# Patient Record
Sex: Male | Born: 1950
Health system: Southern US, Community
[De-identification: ages and names within clinical notes are randomized; demographics above are authoritative.]

## PROBLEM LIST (undated history)

## (undated) DIAGNOSIS — F17200 Nicotine dependence, unspecified, uncomplicated: Secondary | ICD-10-CM

## (undated) DIAGNOSIS — J449 Chronic obstructive pulmonary disease, unspecified: Secondary | ICD-10-CM

## (undated) DIAGNOSIS — E785 Hyperlipidemia, unspecified: Secondary | ICD-10-CM

## (undated) DIAGNOSIS — A150 Tuberculosis of lung: Secondary | ICD-10-CM

## (undated) DIAGNOSIS — R059 Cough, unspecified: Secondary | ICD-10-CM

## (undated) DIAGNOSIS — R9389 Abnormal findings on diagnostic imaging of other specified body structures: Secondary | ICD-10-CM

## (undated) DIAGNOSIS — I10 Essential (primary) hypertension: Secondary | ICD-10-CM

## (undated) DIAGNOSIS — M1990 Unspecified osteoarthritis, unspecified site: Secondary | ICD-10-CM

## (undated) DIAGNOSIS — Z8611 Personal history of tuberculosis: Secondary | ICD-10-CM

## (undated) DIAGNOSIS — R05 Cough: Secondary | ICD-10-CM

## (undated) HISTORY — DX: Chronic obstructive pulmonary disease, unspecified: J44.9

## (undated) HISTORY — DX: Hyperlipidemia, unspecified: E78.5

## (undated) HISTORY — DX: Essential (primary) hypertension: I10

## (undated) HISTORY — DX: Tuberculosis of lung: A15.0

## (undated) HISTORY — DX: Abnormal findings on diagnostic imaging of other specified body structures: R93.89

## (undated) HISTORY — DX: Nicotine dependence, unspecified, uncomplicated: F17.200

## (undated) HISTORY — PX: SECONDARY CLOSURE ARM: SHX5151

## (undated) HISTORY — DX: Cough, unspecified: R05.9

## (undated) HISTORY — DX: Personal history of tuberculosis: Z86.11

## (undated) HISTORY — PX: KNEE SURGERY: SHX244

---
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
## 2012-07-10 ENCOUNTER — Emergency Department (HOSPITAL_COMMUNITY)
Admission: EM | Admit: 2012-07-10 | Discharge: 2012-07-11 | Disposition: A | Discharge status: Home / Self Care | Payer: Self-pay | Attending: Emergency Medicine | Admitting: Emergency Medicine

## 2012-07-10 DIAGNOSIS — Z9889 Other specified postprocedural states: Secondary | ICD-10-CM | POA: Insufficient documentation

## 2012-07-10 DIAGNOSIS — Z791 Long term (current) use of non-steroidal anti-inflammatories (NSAID): Secondary | ICD-10-CM | POA: Insufficient documentation

## 2012-07-10 DIAGNOSIS — H5789 Other specified disorders of eye and adnexa: Secondary | ICD-10-CM | POA: Insufficient documentation

## 2012-07-10 DIAGNOSIS — S0181XA Laceration without foreign body of other part of head, initial encounter: Secondary | ICD-10-CM

## 2012-07-10 DIAGNOSIS — S0010XA Contusion of unspecified eyelid and periocular area, initial encounter: Secondary | ICD-10-CM | POA: Insufficient documentation

## 2012-07-10 DIAGNOSIS — F17200 Nicotine dependence, unspecified, uncomplicated: Secondary | ICD-10-CM | POA: Insufficient documentation

## 2012-07-10 DIAGNOSIS — H05239 Hemorrhage of unspecified orbit: Secondary | ICD-10-CM

## 2012-07-10 DIAGNOSIS — Z79899 Other long term (current) drug therapy: Secondary | ICD-10-CM | POA: Insufficient documentation

## 2012-07-10 DIAGNOSIS — S022XXA Fracture of nasal bones, initial encounter for closed fracture: Secondary | ICD-10-CM | POA: Insufficient documentation

## 2012-07-10 DIAGNOSIS — H40059 Ocular hypertension, unspecified eye: Secondary | ICD-10-CM | POA: Insufficient documentation

## 2012-07-10 DIAGNOSIS — Z23 Encounter for immunization: Secondary | ICD-10-CM | POA: Insufficient documentation

## 2012-07-10 MED ORDER — TETRACAINE HCL 0.5 % OP SOLN
OPHTHALMIC | Status: AC
  Administered 2012-07-11: 02:00:00
  Filled 2012-07-10: qty 2

## 2012-07-10 MED ORDER — FLUORESCEIN SODIUM 1 MG OP STRP
ORAL_STRIP | OPHTHALMIC | Status: AC
  Filled 2012-07-10: qty 1

## 2012-07-10 NOTE — ED Notes (Addendum)
MD at bedside. Dr Hyacinth Meeker suturing pt before triage was done
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
## 2012-07-11 ENCOUNTER — Emergency Department (HOSPITAL_COMMUNITY): Payer: Self-pay

## 2012-07-11 ENCOUNTER — Encounter (HOSPITAL_COMMUNITY): Payer: Self-pay | Admitting: *Deleted

## 2012-07-11 MED ORDER — TIMOLOL MALEATE 0.5 % OP SOLN
1.0000 [drp] | Freq: Once | OPHTHALMIC | Status: AC
  Administered 2012-07-11: 1 [drp] via OPHTHALMIC
  Filled 2012-07-11: qty 5

## 2012-07-11 MED ORDER — BACITRACIN ZINC 500 UNIT/GM EX OINT
TOPICAL_OINTMENT | Freq: Two times a day (BID) | CUTANEOUS | Status: DC
Start: 2012-07-11 — End: 2013-02-05

## 2012-07-11 MED ORDER — BACITRACIN ZINC 500 UNIT/GM EX OINT
TOPICAL_OINTMENT | CUTANEOUS | Status: AC
  Filled 2012-07-11: qty 0.9

## 2012-07-11 MED ORDER — TETANUS-DIPHTH-ACELL PERTUSSIS 5-2.5-18.5 LF-MCG/0.5 IM SUSP
0.5000 mL | Freq: Once | INTRAMUSCULAR | Status: AC
  Administered 2012-07-11: 0.5 mL via INTRAMUSCULAR
  Filled 2012-07-11: qty 0.5

## 2012-07-11 MED ORDER — OXYMETAZOLINE HCL 0.05 % NA SOLN: 2.0000 | Freq: Two times a day (BID) | NASAL | Status: DC

## 2012-07-11 MED ORDER — LIDOCAINE-EPINEPHRINE (PF) 1 %-1:200000 IJ SOLN
INTRAMUSCULAR | Status: AC
  Administered 2012-07-11: 10 mL
  Filled 2012-07-11: qty 10

## 2012-07-11 MED ORDER — OXYCODONE-ACETAMINOPHEN 5-325 MG PO TABS
2.0000 | ORAL_TABLET | Freq: Once | ORAL | Status: AC
  Administered 2012-07-11: 2 via ORAL
  Filled 2012-07-11: qty 2

## 2012-07-11 MED ORDER — LATANOPROST 0.005 % OP SOLN
1.0000 [drp] | Freq: Once | OPHTHALMIC | Status: AC
  Administered 2012-07-11: 1 [drp] via OPHTHALMIC
  Filled 2012-07-11: qty 2.5

## 2012-07-11 MED ORDER — NAPROXEN 500 MG PO TABS: 500.0000 mg | ORAL_TABLET | Freq: Two times a day (BID) | ORAL | Status: DC

## 2012-07-11 MED ORDER — LATANOPROST 0.005 % OP SOLN
OPHTHALMIC | Status: AC
  Filled 2012-07-11: qty 2.5

## 2012-07-11 MED ORDER — OXYCODONE-ACETAMINOPHEN 5-325 MG PO TABS: 1.0000 | ORAL_TABLET | ORAL | Status: DC | PRN
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
## 2012-07-11 NOTE — ED Notes (Signed)
Pt alert & oriented x4, stable gait. Patient given discharge instructions, paperwork & prescription(s). Patient  instructed to stop at the registration desk to finish any additional paperwork. Patient verbalized understanding. Pt left department w/ no further questions. 

## 2013-02-05 ENCOUNTER — Emergency Department (HOSPITAL_COMMUNITY)
Admission: EM | Admit: 2013-02-05 | Discharge: 2013-02-05 | Disposition: A | Discharge status: Home / Self Care | Payer: Self-pay | Attending: Emergency Medicine | Admitting: Emergency Medicine

## 2013-02-05 ENCOUNTER — Encounter (HOSPITAL_COMMUNITY): Payer: Self-pay | Admitting: *Deleted

## 2013-02-05 DIAGNOSIS — S0180XA Unspecified open wound of other part of head, initial encounter: Secondary | ICD-10-CM | POA: Insufficient documentation

## 2013-02-05 DIAGNOSIS — Z880 Allergy status to penicillin: Secondary | ICD-10-CM | POA: Insufficient documentation

## 2013-02-05 DIAGNOSIS — S0990XA Unspecified injury of head, initial encounter: Secondary | ICD-10-CM | POA: Insufficient documentation

## 2013-02-05 DIAGNOSIS — S0181XA Laceration without foreign body of other part of head, initial encounter: Secondary | ICD-10-CM

## 2013-02-05 DIAGNOSIS — F17200 Nicotine dependence, unspecified, uncomplicated: Secondary | ICD-10-CM | POA: Insufficient documentation

## 2013-02-05 MED ORDER — BACITRACIN ZINC 500 UNIT/GM EX OINT: TOPICAL_OINTMENT | Freq: Two times a day (BID) | CUTANEOUS | Status: DC

## 2013-02-05 MED ORDER — NAPROXEN 500 MG PO TABS: 500.0000 mg | ORAL_TABLET | Freq: Two times a day (BID) | ORAL | Status: DC

## 2013-02-05 MED ORDER — BACITRACIN ZINC 500 UNIT/GM EX OINT
TOPICAL_OINTMENT | CUTANEOUS | Status: AC
  Administered 2013-02-05: 21:00:00
  Filled 2013-02-05: qty 0.9

## 2013-02-05 MED ORDER — LIDOCAINE-EPINEPHRINE (PF) 1 %-1:200000 IJ SOLN
INTRAMUSCULAR | Status: AC
  Administered 2013-02-05: 21:00:00
  Filled 2013-02-05: qty 10
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
## 2013-02-05 NOTE — ED Notes (Signed)
EDP at bedside placed 10 sutures in 2 lacerations to left forehead. Pt tolerated well.
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
## 2013-02-05 NOTE — ED Notes (Signed)
Dressing applied per EDP. Bacitracin applied. Bleeding controlled at this time.

## 2016-01-08 ENCOUNTER — Encounter (HOSPITAL_COMMUNITY): Payer: Self-pay | Admitting: Emergency Medicine

## 2016-01-08 ENCOUNTER — Emergency Department (HOSPITAL_COMMUNITY): Payer: PPO

## 2016-01-08 DIAGNOSIS — S72111A Displaced fracture of greater trochanter of right femur, initial encounter for closed fracture: Secondary | ICD-10-CM | POA: Insufficient documentation

## 2016-01-08 DIAGNOSIS — W010XXA Fall on same level from slipping, tripping and stumbling without subsequent striking against object, initial encounter: Secondary | ICD-10-CM | POA: Insufficient documentation

## 2016-01-08 DIAGNOSIS — S199XXA Unspecified injury of neck, initial encounter: Secondary | ICD-10-CM | POA: Diagnosis not present

## 2016-01-08 DIAGNOSIS — S0990XA Unspecified injury of head, initial encounter: Secondary | ICD-10-CM | POA: Diagnosis not present

## 2016-01-08 DIAGNOSIS — S8991XA Unspecified injury of right lower leg, initial encounter: Secondary | ICD-10-CM | POA: Diagnosis not present

## 2016-01-08 DIAGNOSIS — F17210 Nicotine dependence, cigarettes, uncomplicated: Secondary | ICD-10-CM | POA: Insufficient documentation

## 2016-01-08 DIAGNOSIS — Y9389 Activity, other specified: Secondary | ICD-10-CM | POA: Diagnosis not present

## 2016-01-08 DIAGNOSIS — Y929 Unspecified place or not applicable: Secondary | ICD-10-CM | POA: Diagnosis not present

## 2016-01-08 DIAGNOSIS — Y999 Unspecified external cause status: Secondary | ICD-10-CM | POA: Insufficient documentation

## 2016-01-08 DIAGNOSIS — S79921A Unspecified injury of right thigh, initial encounter: Secondary | ICD-10-CM | POA: Diagnosis not present

## 2016-01-08 DIAGNOSIS — M79651 Pain in right thigh: Secondary | ICD-10-CM | POA: Diagnosis not present

## 2016-01-08 NOTE — ED Notes (Addendum)
Patient states he fell approximately 1 hour ago and is complaining of upper right leg pain since fall. Patient admits to drinking 2 beers prior to arrival to ER.

## 2016-01-09 ENCOUNTER — Emergency Department (HOSPITAL_COMMUNITY)
Admission: EM | Admit: 2016-01-09 | Discharge: 2016-01-09 | Disposition: A | Discharge status: Home / Self Care | Payer: PPO | Attending: Emergency Medicine | Admitting: Emergency Medicine

## 2016-01-09 ENCOUNTER — Ambulatory Visit (INDEPENDENT_AMBULATORY_CARE_PROVIDER_SITE_OTHER): Payer: PPO | Admitting: Orthopedic Surgery

## 2016-01-09 ENCOUNTER — Emergency Department (HOSPITAL_COMMUNITY): Payer: PPO

## 2016-01-09 VITALS — BP 121/78 | HR 82 | Ht 66.5 in | Wt 116.0 lb

## 2016-01-09 DIAGNOSIS — S0990XA Unspecified injury of head, initial encounter: Secondary | ICD-10-CM | POA: Diagnosis not present

## 2016-01-09 DIAGNOSIS — S72111A Displaced fracture of greater trochanter of right femur, initial encounter for closed fracture: Secondary | ICD-10-CM

## 2016-01-09 DIAGNOSIS — R52 Pain, unspecified: Secondary | ICD-10-CM

## 2016-01-09 DIAGNOSIS — S199XXA Unspecified injury of neck, initial encounter: Secondary | ICD-10-CM | POA: Diagnosis not present

## 2016-01-09 MED ORDER — OXYCODONE-ACETAMINOPHEN 5-325 MG PO TABS
1.0000 | ORAL_TABLET | Freq: Once | ORAL | Status: AC
  Administered 2016-01-09: 1 via ORAL
  Filled 2016-01-09: qty 1

## 2016-01-09 MED ORDER — HYDROCODONE-ACETAMINOPHEN 10-325 MG PO TABS: 1.0000 | ORAL_TABLET | ORAL | Status: DC | PRN

## 2016-01-09 MED ORDER — OXYCODONE-ACETAMINOPHEN 5-325 MG PO TABS: 1.0000 | ORAL_TABLET | ORAL | Status: DC | PRN

## 2016-01-09 NOTE — ED Notes (Signed)
Pt gone over to CT 

## 2016-01-09 NOTE — Patient Instructions (Signed)
WALKER X 4 WEEKS

## 2016-01-09 NOTE — ED Notes (Signed)
Patient states that he does not want ice pak at this time. States that he is not having any pain, just not weight bearing on right leg.

## 2016-01-09 NOTE — ED Notes (Signed)
Pt requesting something for pain; EDP made aware and new orders given

## 2016-01-09 NOTE — ED Provider Notes (Signed)
CSN: [id number]     Arrival date & time 01/08/16  2239 History   First MD Initiated Contact with Patient 01/09/16 0006     Chief Complaint  Patient presents with  . Leg Pain   HPI  Scott Ferguson is a 65 y.o. male presenting with right leg pain s/p mechanical fall 1 hour PTA. He states he was carrying wood, dropped a piece avoid and subsequently tripped over it, landing onto his left knee. He describes his pain as mid femur in location, radiating up to right hip as well as down to right knee, constant, 10/10 pain scale, achy. He states he is mostly concerned because he has had surgery on his right knee and he wants to ensure that the hardware is in place. No fevers, chills, head injury, loss of consciousness, nausea, vomiting, loss of bowel or bladder control, numbness/tingling.  History reviewed. No pertinent past medical history. Past Surgical History  Procedure Laterality Date  . Knee surgery    . Secondary closure arm     History reviewed. No pertinent family history. Social History  Substance Use Topics  . Smoking status: Current Every Day Smoker -- 0.50 packs/day    Types: Cigarettes  . Smokeless tobacco: None  . Alcohol Use: Yes     Comment: admits to drinking 2 beers tonight    Review of Systems  Ten systems are reviewed and are negative for acute change except as noted in the HPI   Allergies  Penicillins  Home Medications   Prior to Admission medications   Medication Sig Start Date End Date Taking? Authorizing Provider  bacitracin ointment Apply topically 2 (two) times daily. 02/05/13   Eber Hong, MD  naproxen (NAPROSYN) 500 MG tablet Take 1 tablet (500 mg total) by mouth 2 (two) times daily with a meal. 02/05/13   Eber Hong, MD   BP 157/98 mmHg  Pulse 81  Temp(Src) 98 F (36.7 C) (Oral)  Resp 16  Ht 5' 6.5" (1.689 m)  Wt 63.504 kg  BMI 22.26 kg/m2  SpO2 96% Physical Exam  Constitutional: He appears well-developed and well-nourished. No distress.   HENT:  Head: Normocephalic and atraumatic.  Mouth/Throat: Oropharynx is clear and moist. No oropharyngeal exudate.  Eyes: Conjunctivae are normal. Pupils are equal, round, and reactive to light. Right eye exhibits no discharge. Left eye exhibits no discharge. No scleral icterus.  Neck: No tracheal deviation present.  Cardiovascular: Normal rate, regular rhythm, normal heart sounds and intact distal pulses.  Exam reveals no gallop and no friction rub.   No murmur heard. Pulmonary/Chest: Effort normal and breath sounds normal. No respiratory distress. He has no wheezes. He has no rales. He exhibits no tenderness.  Abdominal: Soft. Bowel sounds are normal. He exhibits no distension and no mass. There is no tenderness. There is no rebound and no guarding.  Musculoskeletal: Normal range of motion. He exhibits tenderness. He exhibits no edema.  Mid right femur and right hip tenderness. No discolorations, erythema. NVI BL.   Lymphadenopathy:    He has no cervical adenopathy.  Neurological: He is alert. Coordination normal.  Skin: Skin is warm and dry. No rash noted. He is not diaphoretic. No erythema.  Psychiatric: He has a normal mood and affect. His behavior is normal.  Nursing note and vitals reviewed.   ED Course  Procedures  Imaging Review Dg Femur, Min 2 Views Right  01/08/2016  CLINICAL DATA:  Fall this afternoon while cleaning up a tree that fell in his  yard. Pain in the mid femur. EXAM: RIGHT FEMUR 2 VIEWS COMPARISON:  None. FINDINGS: Questionable cortical irregularity about the greater trochanter versus positioning. The right femur is otherwise intact. Particularly no fracture of the femoral shaft. Postsurgical change noted in the patella. Femoral head is well seated. No focal soft tissue abnormality. IMPRESSION: Questionable cortical irregularity about the greater trochanter of the right hip. This may be artifact if there is no focal pain in this region. No additional fracture of the  right femur. Electronically Signed   By: Rubye OaksMelanie  Ehinger M.D.   On: 01/08/2016 23:18   I have personally reviewed and evaluated these images and lab results as part of my medical decision-making.  MDM   Final diagnoses:  Pain   Patient's right femur XR demonstrates questionable cortical irregularity about the greater trochanter of the right hip. This may be artifact if there is no focal pain in this region. No additional fracture of the right femur. Will CT right hip for further evaluation. No evidence of trauma elsewhere.  Patient states his pain is 10/10 pain scale, but he has had at least 2 beers PTA and is sleeping/resting comfortably upon examination. Doubt DVT and septic joint.  Discussed case with Rancour and Harrison- weight bearing as tolerated and pain meds.  Dr. Manus Gunningancour evaluated patient as well and advises head CT and c-spine.  Care handoff to Dr. Manus Gunningancour at shift change.   Melton KrebsSamantha Nicole Jovonna Nickell, PA-C 01/09/16 [id number]  Glynn OctaveStephen Rancour, MD 01/09/16 [phone]

## 2016-01-09 NOTE — Discharge Instructions (Signed)
Do not put weight on your Right leg. Follow up with Dr. Romeo AppleHarrison. Return to the ED if you develop new or worsening symptoms.

## 2016-01-11 NOTE — Progress Notes (Signed)
Chief Complaint  Patient presents with  . Follow-up    Fractred right hip DOI 01/08/16   HPI 65 year old male fell on the date listed he complains of pain over his right hip dull sometimes sharp with motion fairly severe of one days duration being injured last night pain is constant worse with walking  Review of Systems  Constitutional: Negative for fever and chills.  Musculoskeletal: Negative for back pain and joint pain.  Neurological: Negative for dizziness.    No past medical history on file.  Past Surgical History  Procedure Laterality Date  . Knee surgery    . Secondary closure arm     No family history on file. Social History  Substance Use Topics  . Smoking status: Current Every Day Smoker -- 0.50 packs/day    Types: Cigarettes  . Smokeless tobacco: Not on file  . Alcohol Use: Yes     Comment: admits to drinking 2 beers tonight    Current outpatient prescriptions:  .  oxyCODONE-acetaminophen (PERCOCET/ROXICET) 5-325 MG tablet, Take 1 tablet by mouth every 4 (four) hours as needed for severe pain., Disp: 15 tablet, Rfl: 0 .  bacitracin ointment, Apply topically 2 (two) times daily. (Patient not taking: Reported on 01/09/2016), Disp: 120 g, Rfl: 0 .  HYDROcodone-acetaminophen (NORCO) 10-325 MG tablet, Take 1 tablet by mouth every 4 (four) hours as needed., Disp: 56 tablet, Rfl: 0 .  naproxen (NAPROSYN) 500 MG tablet, Take 1 tablet (500 mg total) by mouth 2 (two) times daily with a meal. (Patient not taking: Reported on 01/09/2016), Disp: 30 tablet, Rfl: 0  BP 121/78 mmHg  Pulse 82  Ht 5' 6.5" (1.689 m)  Wt 116 lb (52.617 kg)  BMI 18.44 kg/m2  Physical Exam  Constitutional: He is oriented to person, place, and time. He appears well-developed and well-nourished. No distress.  Cardiovascular: Normal rate and intact distal pulses.   Neurological: He is alert and oriented to person, place, and time.  Skin: Skin is warm and dry. No rash noted. He is not diaphoretic. No  erythema. No pallor.  Psychiatric: He has a normal mood and affect. His behavior is normal. Judgment and thought content normal.  Ambulation is with a walker with a limp  Right Hip Exam   Tenderness  The patient is experiencing tenderness in the greater trochanter.  Range of Motion  Extension: abnormal  Flexion: abnormal  Internal Rotation: abnormal  External Rotation: abnormal  Abduction: abnormal  Adduction: abnormal   Muscle Strength  Abduction: 3/5  Adduction: 5/5  Flexion: 5/5   Other  Erythema: absent Scars: absent Sensation: normal Pulse: present   Left Hip Exam  Left hip exam is normal.  Tenderness  The patient is experiencing no tenderness.     Range of Motion  The patient has normal left hip ROM.  Muscle Strength  The patient has normal left hip strength.   Tests  FABER: negative  Other  Erythema: absent Scars: absent Sensation: normal Pulse: present       ASSESSMENT: My personal interpretation of the images:  Greater trochanteric fracture right hip nondisplaced    PLAN Protected weightbearing with walker  Prescription opiate analgesia  X-ray on return

## 2016-01-21 ENCOUNTER — Other Ambulatory Visit: Payer: Self-pay | Admitting: Orthopedic Surgery

## 2016-01-21 ENCOUNTER — Telehealth: Payer: Self-pay | Admitting: Orthopedic Surgery

## 2016-01-21 DIAGNOSIS — S72111A Displaced fracture of greater trochanter of right femur, initial encounter for closed fracture: Secondary | ICD-10-CM

## 2016-01-21 MED ORDER — HYDROCODONE-ACETAMINOPHEN 7.5-325 MG PO TABS: 1.0000 | ORAL_TABLET | ORAL | Status: DC | PRN

## 2016-01-21 NOTE — Telephone Encounter (Signed)
Hydrocodone-Acetaminophen 10-325 mg  Qty 56 Tablets  Take 1 tablet by mouth every 4 (four) hours as needed.

## 2016-01-21 NOTE — Telephone Encounter (Signed)
Routing to Dr Harrison for approval 

## 2016-01-21 NOTE — Telephone Encounter (Signed)
Prescription available 

## 2016-02-06 ENCOUNTER — Ambulatory Visit: Payer: PPO | Admitting: Orthopedic Surgery

## 2016-02-06 ENCOUNTER — Ambulatory Visit (INDEPENDENT_AMBULATORY_CARE_PROVIDER_SITE_OTHER): Payer: PPO

## 2016-02-06 VITALS — BP 130/83 | HR 73 | Ht 66.5 in | Wt 116.0 lb

## 2016-02-06 DIAGNOSIS — S72114D Nondisplaced fracture of greater trochanter of right femur, subsequent encounter for closed fracture with routine healing: Secondary | ICD-10-CM | POA: Diagnosis not present

## 2016-02-06 MED ORDER — HYDROCODONE-ACETAMINOPHEN 5-325 MG PO TABS: 1.0000 | ORAL_TABLET | Freq: Four times a day (QID) | ORAL | Status: DC | PRN

## 2016-02-06 MED ORDER — NAPROXEN 500 MG PO TABS: 500.0000 mg | ORAL_TABLET | Freq: Two times a day (BID) | ORAL | Status: DC

## 2016-02-06 NOTE — Progress Notes (Signed)
Patient ID: Scott QuantClarence Faught, male   DOB: 04/10/1951, 65 y.o.   MRN: [id number]  Chief Complaint  Patient presents with  . Follow-up    4 week recheck on right hip fracture with xray, DOI 01-08-16.     HPI 4 weeks ago greater trochanter fracture right hip treated conservatively   ROS  Complains of aching right leg  BP 130/83 mmHg  Pulse 73  Ht 5' 6.5" (1.689 m)  Wt 116 lb (52.617 kg)  BMI 18.44 kg/m2 Gen. appearance is normal grooming and hygiene Orientation to person place and time normal Mood normal Gait is Supported by a cane  No peripheral edema or swelling is noted in the right or left leg Sensory exam shows normal sensation to palpation, pressure and soft touch Skin exam no lacerations ulcerations or erythema  Ortho Exam Hip flexion 120 no tenderness greater trochanter leg lengths equal  A/P  Medical decision-making  X-ray showed healed greater trochanter fracture  Recommend naproxen and hydrocodone decrease dose to 5 mg  Follow-up 4 weeks no x-rays needed  Fuller CanadaStanley Harrison, MD 02/06/2016 4:27 PM

## 2016-03-05 ENCOUNTER — Ambulatory Visit: Payer: PPO | Admitting: Orthopedic Surgery

## 2016-03-05 VITALS — BP 134/82 | HR 93 | Ht 66.5 in | Wt 120.0 lb

## 2016-03-05 DIAGNOSIS — S72114D Nondisplaced fracture of greater trochanter of right femur, subsequent encounter for closed fracture with routine healing: Secondary | ICD-10-CM

## 2016-03-05 MED ORDER — NAPROXEN 500 MG PO TABS: 500.0000 mg | ORAL_TABLET | Freq: Two times a day (BID) | ORAL | 0 refills | Status: DC

## 2016-03-05 MED ORDER — HYDROCODONE-ACETAMINOPHEN 5-325 MG PO TABS: 1.0000 | ORAL_TABLET | Freq: Four times a day (QID) | ORAL | 0 refills | Status: DC | PRN

## 2016-03-05 NOTE — Patient Instructions (Signed)
Take medicines until they run out

## 2016-03-05 NOTE — Progress Notes (Signed)
Follow-up fracture care  Encounter Diagnosis  Name Primary?  . Closed nondisplaced fracture of greater trochanter of right femur with routine healing, subsequent encounter Yes    Injury date 01/08/2016  Patient is now moving his leg well walking normally with a cane.  Exhibits normal hip flexion no pain on passive range of motion  Continue naproxen  Continue tapering Norco 5 mg.  Follow-up as needed

## 2016-09-25 ENCOUNTER — Observation Stay (HOSPITAL_COMMUNITY)
Admission: EM | Admit: 2016-09-25 | Discharge: 2016-09-26 | Disposition: A | Discharge status: Home / Self Care | Payer: PPO | Attending: Internal Medicine | Admitting: Internal Medicine

## 2016-09-25 ENCOUNTER — Emergency Department (HOSPITAL_COMMUNITY): Payer: PPO

## 2016-09-25 ENCOUNTER — Encounter (HOSPITAL_COMMUNITY): Payer: Self-pay | Admitting: Emergency Medicine

## 2016-09-25 DIAGNOSIS — Z791 Long term (current) use of non-steroidal anti-inflammatories (NSAID): Secondary | ICD-10-CM | POA: Diagnosis not present

## 2016-09-25 DIAGNOSIS — I10 Essential (primary) hypertension: Secondary | ICD-10-CM | POA: Diagnosis not present

## 2016-09-25 DIAGNOSIS — Z23 Encounter for immunization: Secondary | ICD-10-CM | POA: Insufficient documentation

## 2016-09-25 DIAGNOSIS — Z5181 Encounter for therapeutic drug level monitoring: Secondary | ICD-10-CM | POA: Diagnosis not present

## 2016-09-25 DIAGNOSIS — F17210 Nicotine dependence, cigarettes, uncomplicated: Secondary | ICD-10-CM | POA: Insufficient documentation

## 2016-09-25 DIAGNOSIS — R0789 Other chest pain: Secondary | ICD-10-CM | POA: Diagnosis not present

## 2016-09-25 DIAGNOSIS — R0602 Shortness of breath: Secondary | ICD-10-CM | POA: Diagnosis not present

## 2016-09-25 DIAGNOSIS — R079 Chest pain, unspecified: Secondary | ICD-10-CM | POA: Diagnosis not present

## 2016-09-25 HISTORY — DX: Essential (primary) hypertension: I10

## 2016-09-25 LAB — RAPID URINE DRUG SCREEN, HOSP PERFORMED
Amphetamines: NOT DETECTED
BARBITURATES: NOT DETECTED
Benzodiazepines: NOT DETECTED
COCAINE: POSITIVE — AB
Opiates: POSITIVE — AB
TETRAHYDROCANNABINOL: NOT DETECTED

## 2016-09-25 LAB — BASIC METABOLIC PANEL
Anion gap: 10 (ref 5–15)
BUN: 8 mg/dL (ref 6–20)
CHLORIDE: 100 mmol/L — AB (ref 101–111)
CO2: 25 mmol/L (ref 22–32)
Calcium: 9.5 mg/dL (ref 8.9–10.3)
Creatinine, Ser: 0.93 mg/dL (ref 0.61–1.24)
GFR calc Af Amer: >60 mL/min (ref >60)
GFR calc non Af Amer: >60 mL/min (ref >60)
GLUCOSE: 94 mg/dL (ref 65–99)
Potassium: 3.8 mmol/L (ref 3.5–5.1)
Sodium: 135 mmol/L (ref 135–145)

## 2016-09-25 LAB — CBC
HCT: 47.5 % (ref 39.0–52.0)
Hemoglobin: 16.1 g/dL (ref 13.0–17.0)
MCH: 31.4 pg (ref 26.0–34.0)
MCHC: 33.9 g/dL (ref 30.0–36.0)
MCV: 92.8 fL (ref 78.0–100.0)
PLATELETS: 374 10*3/uL (ref 150–400)
RBC: 5.12 MIL/uL (ref 4.22–5.81)
RDW: 13.6 % (ref 11.5–15.5)
WBC: 7.6 10*3/uL (ref 4.0–10.5)

## 2016-09-25 LAB — TROPONIN I
Troponin I: <0.03 ng/mL (ref <0.03)
Troponin I: <0.03 ng/mL (ref <0.03)

## 2016-09-25 LAB — ETHANOL: Alcohol, Ethyl (B): <5 mg/dL (ref <5)

## 2016-09-25 MED ORDER — ONDANSETRON HCL 4 MG/2ML IJ SOLN: 4.0000 mg | Freq: Four times a day (QID) | INTRAMUSCULAR | Status: DC | PRN

## 2016-09-25 MED ORDER — PNEUMOCOCCAL VAC POLYVALENT 25 MCG/0.5ML IJ INJ
0.5000 mL | INJECTION | INTRAMUSCULAR | Status: AC
  Administered 2016-09-26: 0.5 mL via INTRAMUSCULAR
  Filled 2016-09-25: qty 0.5

## 2016-09-25 MED ORDER — INFLUENZA VAC SPLIT QUAD 0.5 ML IM SUSY
0.5000 mL | PREFILLED_SYRINGE | INTRAMUSCULAR | Status: AC
  Administered 2016-09-26: 0.5 mL via INTRAMUSCULAR
  Filled 2016-09-25: qty 0.5

## 2016-09-25 MED ORDER — MORPHINE SULFATE (PF) 4 MG/ML IV SOLN
4.0000 mg | INTRAVENOUS | Status: DC | PRN
  Administered 2016-09-25: 4 mg via INTRAVENOUS
  Filled 2016-09-25: qty 1

## 2016-09-25 MED ORDER — ASPIRIN 81 MG PO CHEW
324.0000 mg | CHEWABLE_TABLET | Freq: Once | ORAL | Status: AC
  Administered 2016-09-25: 324 mg via ORAL
  Filled 2016-09-25: qty 4

## 2016-09-25 MED ORDER — NITROGLYCERIN 0.4 MG SL SUBL
0.4000 mg | SUBLINGUAL_TABLET | SUBLINGUAL | Status: AC | PRN
  Administered 2016-09-25 (×3): 0.4 mg via SUBLINGUAL
  Filled 2016-09-25: qty 1

## 2016-09-25 MED ORDER — ENOXAPARIN SODIUM 40 MG/0.4ML ~~LOC~~ SOLN
40.0000 mg | SUBCUTANEOUS | Status: DC
  Administered 2016-09-25: 40 mg via SUBCUTANEOUS
  Filled 2016-09-25: qty 0.4

## 2016-09-25 MED ORDER — ACETAMINOPHEN 325 MG PO TABS
650.0000 mg | ORAL_TABLET | ORAL | Status: DC | PRN
  Administered 2016-09-26 (×2): 650 mg via ORAL
  Filled 2016-09-25 (×2): qty 2

## 2016-09-25 MED ORDER — ISOSORBIDE MONONITRATE ER 60 MG PO TB24
30.0000 mg | ORAL_TABLET | Freq: Every day | ORAL | Status: DC
  Administered 2016-09-25: 30 mg via ORAL
  Filled 2016-09-25 (×2): qty 1

## 2016-09-25 MED ORDER — NITROGLYCERIN 0.4 MG SL SUBL: 0.4000 mg | SUBLINGUAL_TABLET | SUBLINGUAL | Status: DC | PRN

## 2016-09-25 NOTE — ED Notes (Signed)
Pt taken to xray 

## 2016-09-25 NOTE — H&P (Addendum)
History and Physical  Scott QuantClarence Bohl NFA:[id number] DOB: 02/05/1951 DOA: 09/25/2016  Referring physician: EDP PCP: No PCP Per Patient   Chief Complaint: left sided chest pain  HPI: Scott Ferguson is a 66 y.o. male   Who does not have a primary care doctor, does not take medication regularly except BC powder for arthritic pain, presented to [facility] ED with c/o left sided chest pain started two days ago. Pain is intermittent, he denies sob, no dizziness, no recent injury, no n/v. No fever, no cough.  He report pain get worse when laying down, seems to get better when he moves around, per EDP , his chest pain improved with asa and nitroglycerin.   ED course, no presentation his blood pressure is 186/111, he received asa and nitroglycerin, he report chest pain has improved. His blood pressure also has improved. ekg with old anterior infract, otherwise, no acute findings, cxr showed copd changes, but no acute findings, troponin negative. hospitalist called to admit patient for chest pain observation. I have requested EDP to get urine tox screen.     Review of Systems:  Detail per HPI, Review of systems are otherwise negative  History reviewed. No pertinent past medical history. Past Surgical History:  Procedure Laterality Date  . KNEE SURGERY    . SECONDARY CLOSURE ARM     Social History:  reports that he has been smoking Cigarettes.  He has been smoking about 0.50 packs per day. He has never used smokeless tobacco. He reports that he drinks alcohol. He reports that he does not use drugs. Patient lives at home& is able to participate in activities of daily living independently , he report he dose not drive due to does not have driver's license.   Allergies  Allergen Reactions  . Penicillins Other (See Comments)    Childhood reaction. States that he can only take orally and not by injection    No family history on file.    Prior to Admission medications   Medication Sig Start Date End  Date Taking? Authorizing Provider  naproxen (NAPROSYN) 500 MG tablet Take 1 tablet (500 mg total) by mouth 2 (two) times daily with a meal. 03/05/16  Yes Vickki HearingStanley E Harrison, MD    Physical Exam: BP 141/91   Pulse 83   Temp 98.2 F (36.8 C)   Resp 16   Ht 5\' 6"  (1.676 m)   Wt 54.4 kg (120 lb)   SpO2 98%   BMI 19.37 kg/m   General:  NAD Eyes: PERRL ENT: unremarkable Neck: supple, no JVD Cardiovascular: RRR Respiratory: CTABL Abdomen: soft/ND/ND, positive bowel sounds Skin: no rash Musculoskeletal:  No edema Psychiatric: calm/cooperative Neurologic: no focal findings            Labs on Admission:  Basic Metabolic Panel:  Recent Labs Lab 09/25/16 1229  NA 135  K 3.8  CL 100*  CO2 25  GLUCOSE 94  BUN 8  CREATININE 0.93  CALCIUM 9.5   Liver Function Tests: No results for input(s): AST, ALT, ALKPHOS, BILITOT, PROT, ALBUMIN in the last 168 hours. No results for input(s): LIPASE, AMYLASE in the last 168 hours. No results for input(s): AMMONIA in the last 168 hours. CBC:  Recent Labs Lab 09/25/16 1229  WBC 7.6  HGB 16.1  HCT 47.5  MCV 92.8  PLT 374   Cardiac Enzymes:  Recent Labs Lab 09/25/16 1229  TROPONINI <0.03    BNP (last 3 results) No results for input(s): BNP in the last 8760  hours.  ProBNP (last 3 results) No results for input(s): PROBNP in the last 8760 hours.  CBG: No results for input(s): GLUCAP in the last 168 hours.  Radiological Exams on Admission: Dg Chest 2 View  Result Date: 09/25/2016 CLINICAL DATA:  Mid to left-sided chest pain for the past 2 days with associated shortness of breath. Patient is a current smoker. EXAM: CHEST  2 VIEW COMPARISON:  None in PACs FINDINGS: The lungs are hyperinflated with hemidiaphragm flattening. There is no focal infiltrate. There is no pleural effusion or pneumothorax. The heart and pulmonary vascularity are normal. The mediastinum is normal in width. There is mild multilevel degenerative disc  disease of the thoracic spine. There is are rib deformities bilaterally. IMPRESSION: COPD. No pneumonia, CHF, nor other acute cardiopulmonary abnormality. Electronically Signed   By: David  [place] M.D.   On: 09/25/2016 14:09     Assessment/Plan Present on Admission: **None**  Chest pain:  He report chest pain responded to asa and nitroglycerin he does has risk factor for coronary artery disease, including smoking, HTN, family history of heart disease.  ekg with old anterior infarct. Will continue tele, cycle troponin, echocardiogram.  UDS pending, cardiology consult, will keep NPO aftermidnight, incase needing stress test, will check lipid panel.   Alcohol use, report drink 1-2 cans of bear daily  Smoking, half a pack a day, he declined nicotine patch    Addendum: UDS + cocaine, patient denies use. HTN and chest pain could be due to cocaine Will avoid betablocker, start on imdur for blood pressure control.  DVT prophylaxis: lovenox  Consultants: cardiology  Code Status: full   Family Communication:  Patient and two sisters in room  Disposition Plan: med tele obs  Time spent:  Briella Hobday MD, PhD [facility] Pager [phone] If 7PM-7AM, please contact night-coverage at [email or web address], password [facility]

## 2016-09-25 NOTE — ED Notes (Signed)
hospitalist in with pt

## 2016-09-25 NOTE — ED Provider Notes (Signed)
[facility]-EMERGENCY DEPT Provider Note   CSN: [id number] Arrival date & time: 09/25/16  1212     History   Chief Complaint Chief Complaint  Patient presents with  . Chest Pain    HPI Clinton QuantClarence Fassler is a 66 y.o. male.  HPI  Pt was seen at 1255. Per pt, c/o gradual onset and persistence of multiple intermittent episodes of chest "pain" for the past 2 days. Pt describes the CP as located in his left chest, "pressure," with associated nausea. Pt states the CP lasts for varied periods of time before spontaneously improving. Pt does not have specific trigger for CP. Pt states his CP "is worse" today. Pt has been taking BC powders and motrin without improvement. Denies CP worsens with palpation of the area or movement of his arm. Denies hx of similar symptoms. Denies palpitations, no SOB/cough, no abd pain, no vomiting/diarrhea, no back pain.    History reviewed. No pertinent past medical history.  There are no active problems to display for this patient.   Past Surgical History:  Procedure Laterality Date  . KNEE SURGERY    . SECONDARY CLOSURE ARM       Home Medications    Prior to Admission medications   Medication Sig Start Date End Date Taking? Authorizing Provider  bacitracin ointment Apply topically 2 (two) times daily. 02/05/13   Eber HongBrian Miller, MD  HYDROcodone-acetaminophen (NORCO/VICODIN) 5-325 MG tablet Take 1 tablet by mouth every 6 (six) hours as needed for moderate pain. 03/05/16   Vickki HearingStanley E Harrison, MD  naproxen (NAPROSYN) 500 MG tablet Take 1 tablet (500 mg total) by mouth 2 (two) times daily with a meal. 03/05/16   Vickki HearingStanley E Harrison, MD  oxyCODONE-acetaminophen (PERCOCET/ROXICET) 5-325 MG tablet Take 1 tablet by mouth every 4 (four) hours as needed for severe pain. 01/09/16   Glynn OctaveStephen Rancour, MD    Family History No family history on file.  Social History Social History  Substance Use Topics  . Smoking status: Current Every Day Smoker    Packs/day: 0.50    Types:  Cigarettes  . Smokeless tobacco: Never Used  . Alcohol use Yes     Comment: occasional     Allergies   Penicillins   Review of Systems Review of Systems ROS: Statement: All systems negative except as marked or noted in the HPI; Constitutional: Negative for fever and chills. ; ; Eyes: Negative for eye pain, redness and discharge. ; ; ENMT: Negative for ear pain, hoarseness, nasal congestion, sinus pressure and sore throat. ; ; Cardiovascular: +CP. Negative for palpitations, diaphoresis, dyspnea and peripheral edema. ; ; Respiratory: Negative for cough, wheezing and stridor. ; ; Gastrointestinal: +nausea. Negative for vomiting, diarrhea, abdominal pain, blood in stool, hematemesis, jaundice and rectal bleeding. . ; ; Genitourinary: Negative for dysuria, flank pain and hematuria. ; ; Musculoskeletal: Negative for back pain and neck pain. Negative for swelling and trauma.; ; Skin: Negative for pruritus, rash, abrasions, blisters, bruising and skin lesion.; ; Neuro: Negative for headache, lightheadedness and neck stiffness. Negative for weakness, altered level of consciousness, altered mental status, extremity weakness, paresthesias, involuntary movement, seizure and syncope.       Physical Exam Updated Vital Signs BP (!) 186/111   Pulse 86   Temp 98.2 F (36.8 C)   Resp 12   Ht 5\' 6"  (1.676 m)   Wt 120 lb (54.4 kg)   SpO2 100%   BMI 19.37 kg/m    Patient Vitals for the past 24 hrs:  BP  Temp Pulse Resp SpO2 Height Weight  09/25/16 1355 141/91 - 83 16 98 % - -  09/25/16 1344 (!) 145/102 - 78 19 94 % - -  09/25/16 1339 (!) 142/114 - 86 13 97 % - -  09/25/16 1332 - - 66 12 99 % - -  09/25/16 1330 (!) 176/112 - 68 17 100 % - -  09/25/16 1226 (!) 186/111 98.2 F (36.8 C) 86 12 100 % - -  09/25/16 1225 - - - - - 5\' 6"  (1.676 m) 120 lb (54.4 kg)     Physical Exam 1300: Physical examination:  Nursing notes reviewed; Vital signs and O2 SAT reviewed;  Constitutional: Well developed,  Well nourished, Well hydrated, In no acute distress; Head:  Normocephalic, atraumatic; Eyes: EOMI, PERRL, No scleral icterus; ENMT: Mouth and pharynx normal, Mucous membranes moist; Neck: Supple, Full range of motion, No lymphadenopathy; Cardiovascular: Regular rate and rhythm, No gallop; Respiratory: Breath sounds clear & equal bilaterally, No wheezes.  Speaking full sentences with ease, Normal respiratory effort/excursion; Chest: Nontender, Movement normal; Abdomen: Soft, Nontender, Nondistended, Normal bowel sounds; Genitourinary: No CVA tenderness; Extremities: Pulses normal, No tenderness, No edema, No calf edema or asymmetry.; Neuro: AA&Ox3, Major CN grossly intact.  Speech clear. No gross focal motor or sensory deficits in extremities.; Skin: Color normal, Warm, Dry.    ED Treatments / Results  Labs (all labs ordered are listed, but only abnormal results are displayed)   EKG  EKG Interpretation  Date/Time:  Thursday September 25 2016 12:28:00 EST Ventricular Rate:  73 PR Interval:    QRS Duration: 73 QT Interval:  382 QTC Calculation: 421 R Axis:   87 Text Interpretation:  Sinus rhythm Anterior infarct, old No old tracing to compare Confirmed by [facility]  MD, Nicholos Johns [phone]) on 09/25/2016 1:13:40 PM       Radiology   Procedures Procedures (including critical care time)  Medications Ordered in ED Medications  aspirin chewable tablet 324 mg (not administered)  nitroGLYCERIN (NITROSTAT) SL tablet 0.4 mg (not administered)  morphine 4 MG/ML injection 4 mg (not administered)     Initial Impression / Assessment and Plan / ED Course  I have reviewed the triage vital signs and the nursing notes.  Pertinent labs & imaging results that were available during my care of the patient were reviewed by me and considered in my medical decision making (see chart for details).  MDM Reviewed: previous chart, nursing note and vitals Interpretation: labs, ECG and x-ray   Results for  orders placed or performed during the hospital encounter of 09/25/16  Basic metabolic panel  Result Value Ref Range   Sodium 135 135 - 145 mmol/L   Potassium 3.8 3.5 - 5.1 mmol/L   Chloride 100 (L) 101 - 111 mmol/L   CO2 25 22 - 32 mmol/L   Glucose, Bld 94 65 - 99 mg/dL   BUN 8 6 - 20 mg/dL   Creatinine, Ser 7.82 0.61 - 1.24 mg/dL   Calcium 9.5 8.9 - 95.6 mg/dL   GFR calc non Af Amer >60 >60 mL/min   GFR calc Af Amer >60 >60 mL/min   Anion gap 10 5 - 15  CBC  Result Value Ref Range   WBC 7.6 4.0 - 10.5 K/uL   RBC 5.12 4.22 - 5.81 MIL/uL   Hemoglobin 16.1 13.0 - 17.0 g/dL   HCT 21.3 08.6 - 57.8 %   MCV 92.8 78.0 - 100.0 fL   MCH 31.4 26.0 - 34.0 pg  MCHC 33.9 30.0 - 36.0 g/dL   RDW 40.9 81.1 - 91.4 %   Platelets 374 150 - 400 K/uL  Troponin I  Result Value Ref Range   Troponin I <0.03 <0.03 ng/mL   Dg Chest 2 View Result Date: 09/25/2016 CLINICAL DATA:  Mid to left-sided chest pain for the past 2 days with associated shortness of breath. Patient is a current smoker. EXAM: CHEST  2 VIEW COMPARISON:  None in PACs FINDINGS: The lungs are hyperinflated with hemidiaphragm flattening. There is no focal infiltrate. There is no pleural effusion or pneumothorax. The heart and pulmonary vascularity are normal. The mediastinum is normal in width. There is mild multilevel degenerative disc disease of the thoracic spine. There is are rib deformities bilaterally. IMPRESSION: COPD. No pneumonia, CHF, nor other acute cardiopulmonary abnormality. Electronically Signed   By: David  [place] M.D.   On: 09/25/2016 14:09    1430:  ASA, SL ntg and IV morphine given with gradual improvement of CP and HTN. Pt denies hx of HTN.  T/C to [facility] Dr. Roda Shutters, case discussed, including:  HPI, pertinent PM/SHx, VS/PE, dx testing, ED course and treatment:  Agreeable to admit, requests to add UDS and etoh level.     Final Clinical Impressions(s) / ED Diagnoses   Final diagnoses:  None    New Prescriptions New  Prescriptions   No medications on file     Samuel Jester, DO 09/28/16 2208

## 2016-09-25 NOTE — ED Notes (Signed)
Pain from 6 to 5 with 2nd ntg. Pt had to move around to see if pain is better. Pain is worse with movement.

## 2016-09-25 NOTE — ED Notes (Addendum)
Xray aware pt is ready for test now. Pain still 5 from 3rd ntg.

## 2016-09-25 NOTE — ED Triage Notes (Signed)
Pt c/o intermittent cp x 2 days, worse today.

## 2016-09-26 ENCOUNTER — Observation Stay (HOSPITAL_BASED_OUTPATIENT_CLINIC_OR_DEPARTMENT_OTHER): Payer: PPO

## 2016-09-26 ENCOUNTER — Encounter (HOSPITAL_COMMUNITY): Payer: Self-pay | Admitting: Adult Health

## 2016-09-26 DIAGNOSIS — F17210 Nicotine dependence, cigarettes, uncomplicated: Secondary | ICD-10-CM | POA: Diagnosis not present

## 2016-09-26 DIAGNOSIS — I10 Essential (primary) hypertension: Secondary | ICD-10-CM

## 2016-09-26 DIAGNOSIS — R0789 Other chest pain: Principal | ICD-10-CM

## 2016-09-26 DIAGNOSIS — R079 Chest pain, unspecified: Secondary | ICD-10-CM | POA: Diagnosis not present

## 2016-09-26 DIAGNOSIS — Z791 Long term (current) use of non-steroidal anti-inflammatories (NSAID): Secondary | ICD-10-CM | POA: Diagnosis not present

## 2016-09-26 DIAGNOSIS — Z5181 Encounter for therapeutic drug level monitoring: Secondary | ICD-10-CM | POA: Diagnosis not present

## 2016-09-26 DIAGNOSIS — R072 Precordial pain: Secondary | ICD-10-CM

## 2016-09-26 LAB — LIPID PANEL
CHOL/HDL RATIO: 3.7 ratio
CHOLESTEROL: 203 mg/dL — AB (ref 0–200)
HDL: 55 mg/dL (ref >40)
LDL CALC: 113 mg/dL — AB (ref 0–99)
Triglycerides: 177 mg/dL — ABNORMAL HIGH (ref <150)
VLDL: 35 mg/dL (ref 0–40)

## 2016-09-26 LAB — ECHOCARDIOGRAM COMPLETE
Height: 66 in
Weight: 1920.01 oz

## 2016-09-26 LAB — COMPREHENSIVE METABOLIC PANEL
ALBUMIN: 3.8 g/dL (ref 3.5–5.0)
ALK PHOS: 94 U/L (ref 38–126)
ALT: 13 U/L — AB (ref 17–63)
AST: 23 U/L (ref 15–41)
Anion gap: 8 (ref 5–15)
BILIRUBIN TOTAL: 0.4 mg/dL (ref 0.3–1.2)
BUN: 11 mg/dL (ref 6–20)
CALCIUM: 9.1 mg/dL (ref 8.9–10.3)
CO2: 26 mmol/L (ref 22–32)
CREATININE: 0.95 mg/dL (ref 0.61–1.24)
Chloride: 101 mmol/L (ref 101–111)
GFR calc Af Amer: >60 mL/min (ref >60)
GLUCOSE: 101 mg/dL — AB (ref 65–99)
Potassium: 3.7 mmol/L (ref 3.5–5.1)
Sodium: 135 mmol/L (ref 135–145)
TOTAL PROTEIN: 6.7 g/dL (ref 6.5–8.1)

## 2016-09-26 LAB — NM MYOCAR MULTI W/SPECT W/WALL MOTION / EF
CHL CUP NUCLEAR SDS: 3
CHL CUP RESTING HR STRESS: 60 {beats}/min
LHR: 0.21
LV dias vol: 56 mL (ref 62–150)
LVSYSVOL: 17 mL
NUC STRESS TID: 1.01
Peak HR: 110 {beats}/min
SRS: 1
SSS: 4

## 2016-09-26 LAB — MAGNESIUM: MAGNESIUM: 2 mg/dL (ref 1.7–2.4)

## 2016-09-26 LAB — TROPONIN I
Troponin I: <0.03 ng/mL (ref <0.03)
Troponin I: <0.03 ng/mL (ref <0.03)

## 2016-09-26 MED ORDER — REGADENOSON 0.4 MG/5ML IV SOLN
INTRAVENOUS | Status: AC
  Administered 2016-09-26: 0.4 mg via INTRAVENOUS
  Filled 2016-09-26: qty 5

## 2016-09-26 MED ORDER — ASPIRIN EC 81 MG PO TBEC: 81.0000 mg | DELAYED_RELEASE_TABLET | Freq: Every day | ORAL | 0 refills | Status: DC

## 2016-09-26 MED ORDER — ATORVASTATIN CALCIUM 40 MG PO TABS
40.0000 mg | ORAL_TABLET | Freq: Every day | ORAL | Status: DC
  Administered 2016-09-26: 40 mg via ORAL
  Filled 2016-09-26: qty 1

## 2016-09-26 MED ORDER — ATORVASTATIN CALCIUM 40 MG PO TABS: 40.0000 mg | ORAL_TABLET | Freq: Every day | ORAL | 0 refills | Status: DC

## 2016-09-26 MED ORDER — NITROGLYCERIN 0.4 MG SL SUBL: 0.4000 mg | SUBLINGUAL_TABLET | SUBLINGUAL | 0 refills | Status: DC | PRN

## 2016-09-26 MED ORDER — AMLODIPINE BESYLATE 5 MG PO TABS: 5.0000 mg | ORAL_TABLET | Freq: Every day | ORAL | 0 refills | Status: DC

## 2016-09-26 MED ORDER — TECHNETIUM TC 99M TETROFOSMIN IV KIT
10.0000 | PACK | Freq: Once | INTRAVENOUS | Status: AC | PRN
  Administered 2016-09-26: 10.8 via INTRAVENOUS

## 2016-09-26 MED ORDER — TECHNETIUM TC 99M TETROFOSMIN IV KIT
30.0000 | PACK | Freq: Once | INTRAVENOUS | Status: AC | PRN
  Administered 2016-09-26: 29.8 via INTRAVENOUS

## 2016-09-26 MED ORDER — REGADENOSON 0.4 MG/5ML IV SOLN
0.4000 mg | Freq: Once | INTRAVENOUS | Status: AC
  Administered 2016-09-26: 0.4 mg via INTRAVENOUS
  Filled 2016-09-26: qty 5

## 2016-09-26 MED ORDER — SODIUM CHLORIDE 0.9% FLUSH
INTRAVENOUS | Status: AC
  Administered 2016-09-26: 10 mL via INTRAVENOUS
  Filled 2016-09-26: qty 10

## 2016-09-26 NOTE — Care Management Note (Signed)
Case Management Note  Patient Details  Name: Clinton QuantClarence Siragusa MRN: [id number] Date of Birth: 05/07/1951  Subjective/Objective:   Patient adm from home with CP. He is ind with ADL's. He does not have a PCP, does have transportation provided by family /friends if needed. He reports no issues affording medications.                 Action/Plan: Provider list given to patient to establish primary care. No other CM needs.   Expected Discharge Date:       09/26/2016           Expected Discharge Plan:  Home/Self Care  In-House Referral:  NA  Discharge planning Services  CM Consult  Post Acute Care Choice:  NA Choice offered to:  NA  DME Arranged:    DME Agency:     HH Arranged:    HH Agency:     Status of Service:  Completed, signed off  If discussed at [employer] Length of Stay Meetings, dates discussed:    Additional Comments:  Danah Reinecke, Chrystine OilerSharley Diane, RN 09/26/2016, 12:58 PM

## 2016-09-26 NOTE — Care Management Obs Status (Signed)
MEDICARE OBSERVATION STATUS NOTIFICATION   Patient Details  Name: Scott Ferguson MRN: [id number] Date of Birth: 04/30/1951   Medicare Observation Status Notification Given:  Yes    Angee Gupton, Chrystine OilerSharley Diane, RN 09/26/2016, 1:00 PM

## 2016-09-26 NOTE — Progress Notes (Signed)
*  PRELIMINARY RESULTS* Echocardiogram 2D Echocardiogram has been performed.  Jeryl Columbialliott, Archita Lomeli 09/26/2016, 3:20 PM

## 2016-09-26 NOTE — Consult Note (Signed)
CARDIOLOGY CONSULT NOTE   Patient ID: Scott Ferguson MRN: [id number] DOB/AGE: 1950-12-26 66 y.o.  Admit Date: 09/25/2016 Referring Physician: TRH-Xu MD Primary Physician: No PCP Per Patient Consulting Cardiologist: Dina Rich MD Primary Cardiologist: New Reason for Consultation: Chest Pain   Clinical Summary Scott Ferguson is a 66 y.o.male with no prior history of CAD, has not been seen by a PCP for over 30 years. Goes to ER for medical needs. Began to have complaints of left sided chest pain day before admission. Took several Goody Powders and Tylenol for pain control. Rested. No associated dyspnea, diaphoresis, or weakness. Pain went away after several hours. Ate dinner in the evening and began to have recurrent chest pain. Came to ER when pain was not relieved with OTC medications. Pain was constant. He is feeling it again this am, at rest.   On arrival to ER, BP 186/111, HR 86, O2 Sat 100%. Afebrile. UDS positive for cocaine and opiates. Troponin negative X 3. Creatinine 0.93. Negative for anemia. CXR COPD, negative for pneumonia or CHF. EKG, NSR with old anterior infarct, LVH is noted. He was treated with morphine, ASA, and NTG. Repeat EKG reveals evidence of old anterior/septal infarct.   Allergies  Allergen Reactions  . Penicillins Other (See Comments)    Childhood reaction. States that he can only take orally and not by injection    Medications Scheduled Medications: . enoxaparin (LOVENOX) injection  40 mg Subcutaneous Q24H  . Influenza vac split quadrivalent PF  0.5 mL Intramuscular Tomorrow-1000  . isosorbide mononitrate  30 mg Oral Daily  . pneumococcal 23 valent vaccine  0.5 mL Intramuscular Tomorrow-1000    Infusions:   PRN Medications: acetaminophen, nitroGLYCERIN, ondansetron (ZOFRAN) IV   History reviewed. No pertinent past medical history.  Past Surgical History:  Procedure Laterality Date  . KNEE SURGERY    . SECONDARY CLOSURE ARM      Family  History  Problem Relation Age of Onset  . Cancer Mother     Deseased   . Heart attack Father     Deseased   . Hypertension Sister   . CAD Brother     Deseased   . CAD Sister   . Cancer Brother     Deseased      Social History Scott Ferguson reports that he has been smoking Cigarettes.  He has been smoking about 0.50 packs per day. He has never used smokeless tobacco. Scott Ferguson reports that he drinks alcohol.  Review of Systems Complete review of systems are found to be negative unless outlined in H&P above.  Physical Examination Blood pressure (!) 145/73, pulse 60, temperature 98.2 F (36.8 C), temperature source Oral, resp. rate 20, height 5\' 6"  (1.676 m), weight 120 lb (54.4 kg), SpO2 100 %. No intake or output data in the 24 hours ending 09/26/16 0814  Telemetry:NSR without ectopy.   GEN:No acute distress.  HEENT: Conjunctiva and lids normal, oropharynx clear with moist mucosa. Neck: Supple, no elevated JVP or carotid bruits, no thyromegaly. Lungs: Clear to auscultation, nonlabored breathing at rest. Cardiac: Regular rate and rhythm, no S3 or significant systolic murmur, no pericardial rub. Abdomen: Soft, nontender, no hepatomegaly, bowel sounds present, no guarding or rebound. Extremities: No pitting edema, distal pulses 2+. Skin: Warm and dry. Musculoskeletal: No kyphosis. Neuropsychiatric: Alert and oriented x3, affect grossly appropriate.  Prior Cardiac Testing/Procedures None  Lab Results  Basic Metabolic Panel:  Recent Labs Lab 09/25/16 1229 09/26/16 0215  NA 135 135  K 3.8 3.7  CL 100* 101  CO2 25 26  GLUCOSE 94 101*  BUN 8 11  CREATININE 0.93 0.95  CALCIUM 9.5 9.1  MG  --  2.0    Liver Function Tests:  Recent Labs Lab 09/26/16 0215  AST 23  ALT 13*  ALKPHOS 94  BILITOT 0.4  PROT 6.7  ALBUMIN 3.8    CBC:  Recent Labs Lab 09/25/16 1229  WBC 7.6  HGB 16.1  HCT 47.5  MCV 92.8  PLT 374    Cardiac Enzymes:  Recent Labs Lab  09/25/16 1229 09/25/16 1535 09/25/16 2003 09/26/16 0215  TROPONINI <0.03 <0.03 <0.03 <0.03    Radiology: Dg Chest 2 View  Result Date: 09/25/2016 CLINICAL DATA:  Mid to left-sided chest pain for the past 2 days with associated shortness of breath. Patient is a current smoker. EXAM: CHEST  2 VIEW COMPARISON:  None in PACs FINDINGS: The lungs are hyperinflated with hemidiaphragm flattening. There is no focal infiltrate. There is no pleural effusion or pneumothorax. The heart and pulmonary vascularity are normal. The mediastinum is normal in width. There is mild multilevel degenerative disc disease of the thoracic spine. There is are rib deformities bilaterally. IMPRESSION: COPD. No pneumonia, CHF, nor other acute cardiopulmonary abnormality. Electronically Signed   By: David  SwazilandJordan M.D.   On: 09/25/2016 14:09     ECG:NSR with old anterior infarct. LVH is noted.   Impression and Recommendations  1. Chest Pain: Multifactorial. Hypertension, positive for cocaine. Continues to have soreness on the left side this am at rest, some is reproducible with palpation. Echo is pending. May need to plan inpatient stress myoview as he may not follow up. He does not have a PCP. CVRF include FH, tobacco, hypertension, hypercholesterolemia,  polysubstance abuse, male gender, and age. After  Discussion with Dr. Wyline MoodBranch, [facility].  2. Hypertension: Has no reported history but was hypertensive while in ER. Has been placed on isosorbide, ASA presently. BP still moderately elevated. Not on BB in the setting of positive UDS for cocaine.   3. Hypercholesterolemia: Will add atorvastatin 40 mg daily   3. Ongoing tobacco abuse: 25 pk/yr. Cessation is strongly recommended.  4. Cocaine positive UDS: Patient denies.   5. ETOH abuse: Beer 4 or more a day.    Signed: Bettey MareKathryn M. Lawrence NP [facility]  09/26/2016, 8:14 AM Co-Sign MD  Patient seen and discussed with NP Lyman BishopLawrence. 66 yo male history  of HTN, tobacco abuse, and family history of CAD presents with chest pain. Notes mention no primary care and not on any meds at home. Polysubstance abuse including EtoH and cocaine, he is cocaine positive this admission. No evidence of ACS by enzymes or EKG. EKG with SR, LAD, possible old anteroseptal infarct, nonspecific ST/T changes. Several CAD risk factors, in this setting difficult to distinguish between possible CAD vs cocaine related chest pain. Cocaine can also increase risk for CAD and plaque destabilization. We will obtain a lexiscan and echo today to further evaluate.    Dina RichJonathan Lafreda Casebeer MD

## 2016-09-26 NOTE — Progress Notes (Signed)
Patient is to be discharged home and in stable condition. Patient verbalized understanding of new medication regimen and the need for a PCP to follow-up with outpatient. Patient will be escorted out by staff, awaiting transportation.   Quita SkyeMorgan P Dishmon, RN

## 2016-09-26 NOTE — Discharge Summary (Signed)
Discharge Summary  Scott Ferguson ZOX:[id number] DOB: 1951/02/16  PCP: No PCP Per Patient  Admit date: 09/25/2016 Discharge date: 09/26/2016  Time spent: <6mins  Recommendations for Outpatient Follow-up:  1. F/u with PMD within a week  for hospital discharge follow up, repeat cbc/bmp at follow up  Discharge Diagnoses:  Active Hospital Problems   Diagnosis Date Noted  . Chest pain 09/25/2016    Resolved Hospital Problems   Diagnosis Date Noted Date Resolved  No resolved problems to display.    Discharge Condition: stable  Diet recommendation: heart healthy  Filed Weights   09/25/16 1225 09/25/16 1606  Weight: 54.4 kg (120 lb) 54.4 kg (120 lb)    History of present illness:  Chief Complaint: left sided chest pain  HPI: Scott Ferguson is a 66 y.o. male   Who does not have a primary care doctor, does not take medication regularly except BC powder for arthritic pain, presented to [facility] ED with c/o left sided chest pain started two days ago. Pain is intermittent, he denies sob, no dizziness, no recent injury, no n/v. No fever, no cough.  He report pain get worse when laying down, seems to get better when he moves around, per EDP , his chest pain improved with asa and nitroglycerin.   ED course, no presentation his blood pressure is 186/111, he received asa and nitroglycerin, he report chest pain has improved. His blood pressure also has improved. ekg with old anterior infract, otherwise, no acute findings, cxr showed copd changes, but no acute findings, troponin negative. hospitalist called to admit patient for chest pain observation. I have requested EDP to get urine tox screen.   Hospital Course:  Active Problems:   Chest pain   Chest pain:  He report chest pain responded to asa and nitroglycerin he does has risk factor for coronary artery disease, including smoking, HTN, family history of heart disease.  ekg with old anterior infarct.  Negative troponin, unremarkable  echocardiogram and cardiac stress test cardiology consulted, input appreciated, chest pain likely due to cocaine use. Patient is advised to avoid cocaine exposure.   HLD: start on statin  HTN and chest pain could be due to cocaine Will avoid betablocker,  He is discharged on norvasc, asa.  Alcohol use, report drink 1-2 cans of bear daily, likely minimizing use. No sign of withdrawal in the hospital.  Smoking, half a pack a day, he declined nicotine patch   UDS + cocaine, patient denies use.   DVT prophylaxis while in the hospital: lovenox  Consultants: cardiology  Code Status: full   Family Communication:  Patient and two sisters in room  Disposition Plan: home  Procedures:  Cardiac stress test on 2/16 is unremarkable    Discharge Exam: BP (!) 153/96 (BP Location: Left Arm)   Pulse 71   Temp 98.3 F (36.8 C) (Oral)   Resp 20   Ht 5\' 6"  (1.676 m)   Wt 54.4 kg (120 lb)   SpO2 99%   BMI 19.37 kg/m   General: NAD Cardiovascular: RRR Respiratory: CTABL  Discharge Instructions You were cared for by a hospitalist during your hospital stay. If you have any questions about your discharge medications or the care you received while you were in the hospital after you are discharged, you can call the unit and asked to speak with the hospitalist on call if the hospitalist that took care of you is not available. Once you are discharged, your primary care physician will handle any further medical  issues. Please note that NO REFILLS for any discharge medications will be authorized once you are discharged, as it is imperative that you return to your primary care physician (or establish a relationship with a primary care physician if you do not have one) for your aftercare needs so that they can reassess your need for medications and monitor your lab values.  Discharge Instructions    Diet - low sodium heart healthy    Complete by:  As directed    Increase activity  slowly    Complete by:  As directed      Allergies as of 09/26/2016      Reactions   Penicillins Other (See Comments)   Childhood reaction. States that he can only take orally and not by injection      Medication List    TAKE these medications   amLODipine 5 MG tablet Commonly known as:  NORVASC Take 1 tablet (5 mg total) by mouth daily.   aspirin EC 81 MG tablet Take 1 tablet (81 mg total) by mouth daily.   atorvastatin 40 MG tablet Commonly known as:  LIPITOR Take 1 tablet (40 mg total) by mouth daily at 6 PM.   naproxen 500 MG tablet Commonly known as:  NAPROSYN Take 1 tablet (500 mg total) by mouth 2 (two) times daily with a meal.   nitroGLYCERIN 0.4 MG SL tablet Commonly known as:  NITROSTAT Place 1 tablet (0.4 mg total) under the tongue every 5 (five) minutes as needed for chest pain.      Allergies  Allergen Reactions  . Penicillins Other (See Comments)    Childhood reaction. States that he can only take orally and not by injection   Follow-up Information    establish care with pmd Follow up in 2 week(s).   Why:  follow up with pmd for hospital discharge follow up           The results of significant diagnostics from this hospitalization (including imaging, microbiology, ancillary and laboratory) are listed below for reference.    Significant Diagnostic Studies: Dg Chest 2 View  Result Date: 09/25/2016 CLINICAL DATA:  Mid to left-sided chest pain for the past 2 days with associated shortness of breath. Patient is a current smoker. EXAM: CHEST  2 VIEW COMPARISON:  None in PACs FINDINGS: The lungs are hyperinflated with hemidiaphragm flattening. There is no focal infiltrate. There is no pleural effusion or pneumothorax. The heart and pulmonary vascularity are normal. The mediastinum is normal in width. There is mild multilevel degenerative disc disease of the thoracic spine. There is are rib deformities bilaterally. IMPRESSION: COPD. No pneumonia, CHF, nor  other acute cardiopulmonary abnormality. Electronically Signed   By: David  SwazilandJordan M.D.   On: 09/25/2016 14:09   Nm Myocar Multi W/spect W/wall Motion / Ef  Result Date: 09/26/2016  There was no ST segment deviation noted during stress.  The study is normal. There are no perfusion defects consistent with prior infarct or current ischemia.  This is a low risk study.  The left ventricular ejection fraction is hyperdynamic (>65%).     Microbiology: No results found for this or any previous visit (from the past 240 hour(s)).   Labs: Basic Metabolic Panel:  Recent Labs Lab 09/25/16 1229 09/26/16 0215  NA 135 135  K 3.8 3.7  CL 100* 101  CO2 25 26  GLUCOSE 94 101*  BUN 8 11  CREATININE 0.93 0.95  CALCIUM 9.5 9.1  MG  --  2.0  Liver Function Tests:  Recent Labs Lab 09/26/16 0215  AST 23  ALT 13*  ALKPHOS 94  BILITOT 0.4  PROT 6.7  ALBUMIN 3.8   No results for input(s): LIPASE, AMYLASE in the last 168 hours. No results for input(s): AMMONIA in the last 168 hours. CBC:  Recent Labs Lab 09/25/16 1229  WBC 7.6  HGB 16.1  HCT 47.5  MCV 92.8  PLT 374   Cardiac Enzymes:  Recent Labs Lab 09/25/16 1229 09/25/16 1535 09/25/16 2003 09/26/16 0215 09/26/16 0846  TROPONINI <0.03 <0.03 <0.03 <0.03 <0.03   BNP: BNP (last 3 results) No results for input(s): BNP in the last 8760 hours.  ProBNP (last 3 results) No results for input(s): PROBNP in the last 8760 hours.  CBG: No results for input(s): GLUCAP in the last 168 hours.     SignedAlbertine Grates MD, PhD  [facility] 09/26/2016, 7:33 PM

## 2017-02-24 ENCOUNTER — Encounter: Payer: Self-pay | Admitting: Orthopaedic Surgery

## 2017-02-24 ENCOUNTER — Ambulatory Visit (INDEPENDENT_AMBULATORY_CARE_PROVIDER_SITE_OTHER): Payer: PPO

## 2017-02-24 ENCOUNTER — Ambulatory Visit (INDEPENDENT_AMBULATORY_CARE_PROVIDER_SITE_OTHER): Payer: PPO | Admitting: Orthopaedic Surgery

## 2017-02-24 VITALS — BP 143/92 | HR 112 | Temp 97.5°F | Ht 66.0 in | Wt 114.0 lb

## 2017-02-24 DIAGNOSIS — G8929 Other chronic pain: Secondary | ICD-10-CM

## 2017-02-24 DIAGNOSIS — M5442 Lumbago with sciatica, left side: Secondary | ICD-10-CM

## 2017-02-24 MED ORDER — HYDROCODONE-ACETAMINOPHEN 5-325 MG PO TABS: ORAL_TABLET | ORAL | 0 refills | Status: DC

## 2017-02-24 NOTE — Progress Notes (Signed)
Scott Ferguson, male DOB:Mar 29, 1951, 66 y.o. [id number]  Chief Complaint  Scott presents with  . New Problem    Left Hip Pain    HPI  Scott Ferguson is a 66 y.o. male who has chronic lower back pain with left sided sciatica.  It has been bothering him for many months getting worse over the last six weeks.  I saw him last year for right trochanteric injury.  He has done well from that.  For his back he has tried heat, ice, rubs, Advil, Tylenol with little help.  It seems to be worse.  He has pain running down his left lateral leg past the knee to the lateral left foot.  He has no swelling, no trauma, no redness.  He has problems now sleeping.  He has been taking Naprosyn with no help. HPI  Body mass index is 18.4 kg/m.  ROS  Review of Systems  HENT: Negative for congestion.   Respiratory: Negative for cough and shortness of breath.   Cardiovascular: Negative for chest pain and leg swelling.  Endocrine: Positive for cold intolerance.  Musculoskeletal: Positive for arthralgias, back pain and gait problem.  Allergic/Immunologic: Positive for environmental allergies.   Past Medical History:  Diagnosis Date  . Hypertension     Past Surgical History:  Procedure Laterality Date  . KNEE SURGERY    . SECONDARY CLOSURE ARM      Current Outpatient Prescriptions on File Prior to Visit  Medication Sig Dispense Refill  . amLODipine (NORVASC) 5 MG tablet Take 1 tablet (5 mg total) by mouth daily. 30 tablet 0  . aspirin EC 81 MG tablet Take 1 tablet (81 mg total) by mouth daily. 30 tablet 0  . atorvastatin (LIPITOR) 40 MG tablet Take 1 tablet (40 mg total) by mouth daily at 6 PM. 30 tablet 0  . naproxen (NAPROSYN) 500 MG tablet Take 1 tablet (500 mg total) by mouth 2 (two) times daily with a meal. 60 tablet 0  . nitroGLYCERIN (NITROSTAT) 0.4 MG SL tablet Place 1 tablet (0.4 mg total) under the tongue every 5 (five) minutes as needed for chest pain. 30 tablet 0   No  current facility-administered medications on file prior to visit.     Social History   Social History  . Marital status: Divorced    Spouse name: N/A  . Number of children: N/A  . Years of education: N/A   Occupational History  . Not on file.   Social History Main Topics  . Smoking status: Current Every Day Smoker    Packs/day: 0.50    Types: Cigarettes  . Smokeless tobacco: Never Used  . Alcohol use Yes     Comment: occasional  . Drug use: No     Comment: Cocaine postive in [facility] but denies use  . Sexual activity: Not on file   Other Topics Concern  . Not on file   Social History Narrative  . No narrative on file    Family History  Problem Relation Age of Onset  . Cancer Mother        Deseased   . Heart attack Father        Deseased   . Hypertension Sister   . CAD Brother        Deseased   . CAD Sister   . Cancer Brother        Deseased     BP (!) 143/92   Pulse (!) 112   Temp (!) 97.5 F (  36.4 C)   Ht 5\' 6"  (1.676 m)   Wt 114 lb (51.7 kg)   BMI 18.40 kg/m       Physical Exam  Blood pressure (!) 143/92, pulse (!) 112, temperature (!) 97.5 F (36.4 C), height 5\' 6"  (1.676 m), weight 114 lb (51.7 kg).  Constitutional: overall normal hygiene, normal nutrition, well developed, normal grooming, normal body habitus. Assistive device:none  Musculoskeletal: gait and station Limp none, muscle tone and strength are normal, no tremors or atrophy is present.  .  Neurological: coordination overall normal.  Deep tendon reflex/nerve stretch intact.  Sensation normal.  Cranial nerves II-XII intact.   Skin:   Normal overall no scars, lesions, ulcers or rashes. No psoriasis.  Psychiatric: Alert and oriented x 3.  Recent memory intact, remote memory unclear.  Normal mood and affect. Well groomed.  Good eye contact.  Cardiovascular: overall no swelling, no varicosities, no edema bilaterally, normal temperatures of the legs and arms, no clubbing, cyanosis and good  capillary refill.  Lymphatic: palpation is normal.  Spine/Pelvis examination:  Inspection:  Overall, sacoiliac joint benign and hips nontender; without crepitus or defects.   Thoracic spine inspection: Alignment normal without kyphosis present   Lumbar spine inspection:  Alignment  with normal lumbar lordosis, without scoliosis apparent.   Thoracic spine palpation:  without tenderness of spinal processes   Lumbar spine palpation: with tenderness of lumbar area; without tightness of lumbar muscles    Range of Motion:   Lumbar flexion, forward flexion is full without pain or tenderness    Lumbar extension is 10 without pain or tenderness   Left lateral bend is Normal  without pain or tenderness   Right lateral bend is Normal without pain or tenderness   Straight leg raising is Normal   Strength & tone: Normal   Stability overall normal stability     The Scott has been educated about the nature of the problem(s) and counseled on treatment options.  The Scott appeared to understand what I have discussed and is in agreement with it.  Encounter Diagnosis  Name Primary?  . Chronic left-sided low back pain with left-sided sciatica Yes    PLAN Call if any problems.  Precautions discussed.  Continue current medications.   Return to clinic after MRI lumbar spine   I would like to get a MRI as he has not improved.  I have reviewed the [place] Cont[place] Substance Reporting System web site prior to prescribing narcotic medicine for this Scott.  Electronically Signed Darreld McleanWayne Soo Steelman, MD 7/17/20182:43 PM

## 2017-03-03 ENCOUNTER — Ambulatory Visit (HOSPITAL_COMMUNITY)
Admission: RE | Admit: 2017-03-03 | Discharge: 2017-03-03 | Disposition: A | Discharge status: Home / Self Care | Payer: PPO | Source: Ambulatory Visit | Attending: Orthopaedic Surgery | Admitting: Orthopaedic Surgery

## 2017-03-03 DIAGNOSIS — G8929 Other chronic pain: Secondary | ICD-10-CM | POA: Insufficient documentation

## 2017-03-03 DIAGNOSIS — M5127 Other intervertebral disc displacement, lumbosacral region: Secondary | ICD-10-CM | POA: Diagnosis not present

## 2017-03-03 DIAGNOSIS — M5126 Other intervertebral disc displacement, lumbar region: Secondary | ICD-10-CM | POA: Diagnosis not present

## 2017-03-03 DIAGNOSIS — M48061 Spinal stenosis, lumbar region without neurogenic claudication: Secondary | ICD-10-CM | POA: Insufficient documentation

## 2017-03-03 DIAGNOSIS — M5442 Lumbago with sciatica, left side: Secondary | ICD-10-CM | POA: Diagnosis not present

## 2017-03-03 DIAGNOSIS — M545 Low back pain: Secondary | ICD-10-CM | POA: Diagnosis not present

## 2017-03-05 ENCOUNTER — Ambulatory Visit (INDEPENDENT_AMBULATORY_CARE_PROVIDER_SITE_OTHER): Payer: PPO | Admitting: Orthopaedic Surgery

## 2017-03-05 ENCOUNTER — Encounter: Payer: Self-pay | Admitting: Orthopaedic Surgery

## 2017-03-05 VITALS — BP 118/75 | HR 87 | Temp 98.1°F | Ht 66.0 in | Wt 109.0 lb

## 2017-03-05 DIAGNOSIS — G8929 Other chronic pain: Secondary | ICD-10-CM | POA: Diagnosis not present

## 2017-03-05 DIAGNOSIS — M5442 Lumbago with sciatica, left side: Secondary | ICD-10-CM | POA: Diagnosis not present

## 2017-03-05 DIAGNOSIS — F17210 Nicotine dependence, cigarettes, uncomplicated: Secondary | ICD-10-CM | POA: Diagnosis not present

## 2017-03-05 MED ORDER — HYDROCODONE-ACETAMINOPHEN 5-325 MG PO TABS: 1.0000 | ORAL_TABLET | ORAL | 0 refills | Status: DC | PRN

## 2017-03-05 NOTE — Progress Notes (Signed)
Patient AV:WUJWJXBJ:Scott Ferguson, male DOB:10/13/1950, 66 y.o. [id number]  Chief Complaint  Patient presents with  . Follow-up    MRI REVIEW L SPINE    HPI  Scott Ferguson is a 66 y.o. male who has chronic back pain. He has left sided paresthesias.  He had MRI which showed: IMPRESSION: Mild spinal stenosis L2-3. Small left-sided disc protrusion with subarticular stenosis on the left.  Moderate spinal stenosis L4-5. Small left-sided disc protrusion extending into the foramen. Subarticular foraminal encroachment is present bilaterally which could affect the L4 and L5 nerve roots bilaterally.  Left extraforaminal disc protrusion/osteophyte L5-S1  I have explained the findings to him.  I have recommended possible epidurals.  I will have physician in [place] see [place] and see if he is a candidate for epidurals.  He continues to have left sided paresthesias.  He has no new trauma.  HPI  Body mass index is 17.59 kg/m.  ROS  Review of Systems  HENT: Negative for congestion.   Respiratory: Negative for cough and shortness of breath.   Cardiovascular: Negative for chest pain and leg swelling.  Endocrine: Positive for cold intolerance.  Musculoskeletal: Positive for arthralgias, back pain and gait problem.  Allergic/Immunologic: Positive for environmental allergies.    Past Medical History:  Diagnosis Date  . Hypertension     Past Surgical History:  Procedure Laterality Date  . KNEE SURGERY    . SECONDARY CLOSURE ARM      Family History  Problem Relation Age of Onset  . Cancer Mother        Deseased   . Heart attack Father        Deseased   . Hypertension Sister   . CAD Brother        Deseased   . CAD Sister   . Cancer Brother        Deseased     Social History Social History  Substance Use Topics  . Smoking status: Current Every Day Smoker    Packs/day: 0.50    Types: Cigarettes  . Smokeless tobacco: Never Used  . Alcohol use Yes     Comment: occasional     Allergies  Allergen Reactions  . Penicillins Other (See Comments)    Childhood reaction. States that he can only take orally and not by injection    Current Outpatient Prescriptions  Medication Sig Dispense Refill  . amLODipine (NORVASC) 5 MG tablet Take 1 tablet (5 mg total) by mouth daily. 30 tablet 0  . aspirin EC 81 MG tablet Take 1 tablet (81 mg total) by mouth daily. 30 tablet 0  . atorvastatin (LIPITOR) 40 MG tablet Take 1 tablet (40 mg total) by mouth daily at 6 PM. 30 tablet 0  . HYDROcodone-acetaminophen (NORCO/VICODIN) 5-325 MG tablet Take 1 tablet by mouth every 4 (four) hours as needed for moderate pain (Must last 14 days.Do not take and drive a car or use machinery.). 56 tablet 0  . naproxen (NAPROSYN) 500 MG tablet Take 1 tablet (500 mg total) by mouth 2 (two) times daily with a meal. 60 tablet 0  . nitroGLYCERIN (NITROSTAT) 0.4 MG SL tablet Place 1 tablet (0.4 mg total) under the tongue every 5 (five) minutes as needed for chest pain. 30 tablet 0   No current facility-administered medications for this visit.      Physical Exam  Blood pressure 118/75, pulse 87, temperature 98.1 F (36.7 C), height 5\' 6"  (1.676 m), weight 109 lb (49.4 kg).  Constitutional: overall normal  hygiene, normal nutrition, well developed, normal grooming, normal body habitus. Assistive device:none  Musculoskeletal: gait and station Limp none, muscle tone and strength are normal, no tremors or atrophy is present.  .  Neurological: coordination overall normal.  Deep tendon reflex/nerve stretch intact.  Sensation normal.  Cranial nerves II-XII intact.   Skin:   Normal overall no scars, lesions, ulcers or rashes. No psoriasis.  Psychiatric: Alert and oriented x 3.  Recent memory intact, remote memory unclear.  Normal mood and affect. Well groomed.  Good eye contact.  Cardiovascular: overall no swelling, no varicosities, no edema bilaterally, normal temperatures of the legs and arms, no  clubbing, cyanosis and good capillary refill.  Lymphatic: palpation is normal.  Spine/Pelvis examination:  Inspection:  Overall, sacoiliac joint benign and hips nontender; without crepitus or defects.   Thoracic spine inspection: Alignment normal without kyphosis present   Lumbar spine inspection:  Alignment  with normal lumbar lordosis, without scoliosis apparent.   Thoracic spine palpation:  without tenderness of spinal processes   Lumbar spine palpation: with tenderness of lumbar area; without tightness of lumbar muscles    Range of Motion:   Lumbar flexion, forward flexion is 40 without pain or tenderness    Lumbar extension is 10 without pain or tenderness   Left lateral bend is Normal  without pain or tenderness   Right lateral bend is Normal without pain or tenderness   Straight leg raising is Normal   Strength & tone: Normal   Stability overall normal stability    He continues to smoke and is willing to cut back.  The patient has been educated about the nature of the problem(s) and counseled on treatment options.  The patient appeared to understand what I have discussed and is in agreement with it.  Encounter Diagnoses  Name Primary?  . Chronic left-sided low back pain with left-sided sciatica Yes  . Cigarette nicotine dependence without complication     PLAN Call if any problems.  Precautions discussed.  Continue current medications.   Return to clinic 1 month   To be evaluated for epidural.   Electronically Signed Darreld McleanWayne Aariv Medlock, MD 7/26/201811:08 AM

## 2017-03-05 NOTE — Patient Instructions (Signed)
Steps to Quit Smoking Smoking tobacco can be bad for your health. It can also affect almost every organ in your body. Smoking puts you and people around you at risk for many serious long-lasting (chronic) diseases. Quitting smoking is hard, but it is one of the best things that you can do for your health. It is never too late to quit. What are the benefits of quitting smoking? When you quit smoking, you lower your risk for getting serious diseases and conditions. They can include:  Lung cancer or lung disease.  Heart disease.  Stroke.  Heart attack.  Not being able to have children (infertility).  Weak bones (osteoporosis) and broken bones (fractures).  If you have coughing, wheezing, and shortness of breath, those symptoms may get better when you quit. You may also get sick less often. If you are pregnant, quitting smoking can help to lower your chances of having a baby of low birth weight. What can I do to help me quit smoking? Talk with your doctor about what can help you quit smoking. Some things you can do (strategies) include:  Quitting smoking totally, instead of slowly cutting back how much you smoke over a period of time.  Going to in-person counseling. You are more likely to quit if you go to many counseling sessions.  Using resources and support systems, such as: ? Online chats with a counselor. ? Phone quitlines. ? Printed self-help materials. ? Support groups or group counseling. ? Text messaging programs. ? Mobile phone apps or applications.  Taking medicines. Some of these medicines may have nicotine in them. If you are pregnant or breastfeeding, do not take any medicines to quit smoking unless your doctor says it is okay. Talk with your doctor about counseling or other things that can help you.  Talk with your doctor about using more than one strategy at the same time, such as taking medicines while you are also going to in-person counseling. This can help make  quitting easier. What things can I do to make it easier to quit? Quitting smoking might feel very hard at first, but there is a lot that you can do to make it easier. Take these steps:  Talk to your family and friends. Ask them to support and encourage you.  Call phone quitlines, reach out to support groups, or work with a counselor.  Ask people who smoke to not smoke around you.  Avoid places that make you want (trigger) to smoke, such as: ? Bars. ? Parties. ? Smoke-break areas at work.  Spend time with people who do not smoke.  Lower the stress in your life. Stress can make you want to smoke. Try these things to help your stress: ? Getting regular exercise. ? Deep-breathing exercises. ? Yoga. ? Meditating. ? Doing a body scan. To do this, close your eyes, focus on one area of your body at a time from head to toe, and notice which parts of your body are tense. Try to relax the muscles in those areas.  Download or buy apps on your mobile phone or tablet that can help you stick to your quit plan. There are many free apps, such as QuitGuide from the CDC (Centers for Disease Control and Prevention). You can find more support from smokefree.gov and other websites.  This information is not intended to replace advice given to you by your health care provider. Make sure you discuss any questions you have with your health care provider. Document Released: 05/24/2009 Document   Revised: 03/25/2016 Document Reviewed: 12/12/2014 Elsevier Interactive Patient Education  2018 [employer]  

## 2017-03-24 ENCOUNTER — Ambulatory Visit (INDEPENDENT_AMBULATORY_CARE_PROVIDER_SITE_OTHER): Payer: PPO

## 2017-03-24 ENCOUNTER — Ambulatory Visit (INDEPENDENT_AMBULATORY_CARE_PROVIDER_SITE_OTHER): Payer: PPO | Admitting: Physical Medicine and Rehabilitation

## 2017-03-24 ENCOUNTER — Encounter (INDEPENDENT_AMBULATORY_CARE_PROVIDER_SITE_OTHER): Payer: Self-pay | Admitting: Physical Medicine and Rehabilitation

## 2017-03-24 VITALS — BP 115/85 | HR 90

## 2017-03-24 DIAGNOSIS — M5416 Radiculopathy, lumbar region: Secondary | ICD-10-CM

## 2017-03-24 MED ORDER — LIDOCAINE HCL (PF) 1 % IJ SOLN
2.0000 mL | Freq: Once | INTRAMUSCULAR | Status: AC
  Administered 2017-03-24: 2 mL

## 2017-03-24 MED ORDER — BETAMETHASONE SOD PHOS & ACET 6 (3-3) MG/ML IJ SUSP
12.0000 mg | Freq: Once | INTRAMUSCULAR | Status: AC
  Administered 2017-03-24: 12 mg

## 2017-03-24 NOTE — Progress Notes (Deleted)
Left side low back pain radiating down front of leg to foot for 2 to 3 months. Numbness and tingling in foot. Pain with walking and standing. Relief with sitting.90

## 2017-03-24 NOTE — Patient Instructions (Signed)

## 2017-03-24 NOTE — Progress Notes (Unsigned)
Fluoro Time: 28 sec Mgy:12.72

## 2017-03-26 ENCOUNTER — Encounter (INDEPENDENT_AMBULATORY_CARE_PROVIDER_SITE_OTHER): Payer: Self-pay | Admitting: Physical Medicine and Rehabilitation

## 2017-03-26 NOTE — Progress Notes (Signed)
Scott QuantClarence Ferguson - 66 y.o. male MRN [id number]  Date of birth: 11/21/1950  Office Visit Note: Visit Date: 03/24/2017 PCP: Patient, No Pcp Per Referred by: Darreld McleanKeeling, Wayne, MD  Subjective: Chief Complaint  Patient presents with  . Lower Back - Pain   HPI: Scott Ferguson is a 66 year old gentleman began seeing Dr. Hilda LiasKeeling back in the early part of July with low back pain worsening severe left radicular sciatica-type complaints. He reports symptoms of just worsening over the weeks before he saw Dr. Hilda LiasKeeling but is really been having symptoms for several months. He has tried anti-inflammatories and muscle relaxers without relief. Dr. Hilda LiasKeeling has placed him on some hydrocodone which takes the edge off. MRI of the lumbar spine was performed which is reviewed below. Patient's had no relevant lumbar spine surgery or prior intervention. He reports no right-sided complaints. His symptoms are down the posterior lateral part of the left leg more consistent with an L5 dermatome. He has not noted any focal weakness or bowel or bladder difficulties. He does feel like his symptoms are worse with prolonged standing or prolonged sitting. Again he does get some numbness and tingling down to the foot. He does get relief with sitting if he's been standing for a while but prolonged sitting as difficult as well.    Review of Systems  Constitutional: Negative for chills, fever, malaise/fatigue and weight loss.  HENT: Negative for hearing loss and sinus pain.   Eyes: Negative for blurred vision, double vision and photophobia.  Respiratory: Negative for cough and shortness of breath.   Cardiovascular: Negative for chest pain, palpitations and leg swelling.  Gastrointestinal: Negative for abdominal pain, nausea and vomiting.  Genitourinary: Negative for flank pain.  Musculoskeletal: Positive for back pain. Negative for myalgias.       Left leg pain  Skin: Negative for itching and rash.  Neurological: Positive for tingling.  Negative for tremors, focal weakness and weakness.  Endo/Heme/Allergies: Negative.   Psychiatric/Behavioral: Negative for depression.  All other systems reviewed and are negative.  Otherwise per HPI.  Assessment & Plan: Visit Diagnoses:  1. Lumbar radiculopathy     Plan: Findings:  Chronic worsening severe low back pain with lumbar radiculitis radiculopathy in L5 dermatome on the left. This is in the setting of MRI evidence of left L2-3 small foraminal protrusion which I think is asymptomatic but with degenerative disc changes at L4-5 left-sided disc protrusion with extension and likely compression of the left L4 nerve root in the foramen and moderate stenosis centrally at this level. There is also an L5 disc osteophyte complex extraforaminal in L5-S1. Either of these 2 areas could cause his symptoms. I do think that the amount of pain that he is having it is worthwhile to complete diagnostic and hopefully therapeutic left L4 transforaminal epidural steroid injection. Depending on his relief we may need to repeat 1 versus different type or level of epidural injection. If he's not getting much relief unfortunately he probably would be a good surgical candidate for decompression laminectomy. If he gets good relief then intermittent injection for symptomatic relief could be performed. The disc protrusion at L4-5 may resorb to a degree and even though he is having some level of stenosis this may actually get better with time. He'll follow up with myself or Dr. Hilda LiasKeeling depending on the results of the epidural injection. This was done today given the amount of pain he was having.    Meds & Orders:  Meds ordered this encounter  Medications  .  lidocaine (PF) (XYLOCAINE) 1 % injection 2 mL  . betamethasone acetate-betamethasone sodium phosphate (CELESTONE) injection 12 mg    Orders Placed This Encounter  Procedures  . XR C-ARM NO REPORT  . Epidural Steroid injection    Follow-up: Return if symptoms  worsen or fail to improve.   Procedures: No procedures performed  Lumbosacral Transforaminal Epidural Steroid Injection - Sub-Pedicular Approach with Fluoroscopic Guidance  Patient: Scott Ferguson      Date of Birth: [birth year] MRN: [id number] PCP: Patient, No Pcp Per      Visit Date: 03/24/2017   Universal Protocol:    Date/Time: 03/24/2017  Consent Given By: the patient  Position: PRONE  Additional Comments: Vital signs were monitored before and after the procedure. Patient was prepped and draped in the usual sterile fashion. The correct patient, procedure, and site was verified.   Injection Procedure Details:  Procedure Site One Meds Administered:  Meds ordered this encounter  Medications  . lidocaine (PF) (XYLOCAINE) 1 % injection 2 mL  . betamethasone acetate-betamethasone sodium phosphate (CELESTONE) injection 12 mg    Laterality: Left  Location/Site:  L4-L5  Needle size: 22 G  Needle type: Spinal  Needle Placement: Transforaminal  Findings:  -Contrast Used: 1 mL iohexol 180 mg iodine/mL   -Comments: Excellent flow of contrast along the nerve and into the epidural space.  Procedure Details: After squaring off the end-plates to get a true AP view, the C-arm was positioned so that an oblique view of the foramen as noted above was visualized. The target area is just inferior to the "nose of the scotty dog" or sub pedicular. The soft tissues overlying this structure were infiltrated with 2-3 ml. of 1% Lidocaine without Epinephrine.  The spinal needle was inserted toward the target using a "trajectory" view along the fluoroscope beam.  Under AP and lateral visualization, the needle was advanced so it did not puncture dura and was located close the 6 O'Clock position of the pedical in AP tracterory. Biplanar projections were used to confirm position. Aspiration was confirmed to be negative for CSF and/or blood. A 1-2 ml. volume of Isovue-250 was injected and flow of  contrast was noted at each level. Radiographs were obtained for documentation purposes.   After attaining the desired flow of contrast documented above, a 0.5 to 1.0 ml test dose of 0.25% Marcaine was injected into each respective transforaminal space.  The patient was observed for 90 seconds post injection.  After no sensory deficits were reported, and normal lower extremity motor function was noted,   the above injectate was administered so that equal amounts of the injectate were placed at each foramen (level) into the transforaminal epidural space.   Additional Comments:  The patient tolerated the procedure well Dressing: Band-Aid    Post-procedure details: Patient was observed during the procedure. Post-procedure instructions were reviewed.  Patient left the clinic in stable condition.       Clinical History: MRI LUMBAR SPINE WITHOUT CONTRAST 03/03/2017   TECHNIQUE: Multiplanar, multisequence MR imaging of the lumbar spine was performed. No intravenous contrast was administered.  COMPARISON: Lumbar radiographs 02/24/2017  FINDINGS: Segmentation: Normal  Alignment: Normal  Vertebrae: Negative for fracture or mass.  Conus medullaris: Extends to the L1-2 level and appears normal.  Paraspinal and other soft tissues: Negative  Disc levels:  L1-2: Diffuse disc bulging without significant stenosis  L2-3: Moderate disc degeneration with diffuse disc bulging. Mild spinal stenosis. Small left-sided disc protrusion with subarticular stenosis on the left which could  affect the left L3 nerve root. Moderate foraminal encroachment bilaterally.  L3-4: Diffuse bulging of the disc without significant stenosis  L4-5: Moderate disc degeneration with diffuse disc bulging and spurring. Small left-sided disc protrusion extending caudally and compressing the left L4 nerve root in the foramen. Bilateral facet hypertrophy. Moderate spinal stenosis. Subarticular and  foraminal encroachment bilaterally.  L5-S1: Disc degeneration with diffuse disc bulging. Extraforaminal disc protrusion/ osteophyte on the left displacing the left L5 nerve root. Left foraminal narrowing.  IMPRESSION: Mild spinal stenosis L2-3. Small left-sided disc protrusion with subarticular stenosis on the left.  Moderate spinal stenosis L4-5. Small left-sided disc protrusion extending into the foramen. Subarticular foraminal encroachment is present bilaterally which could affect the L4 and L5 nerve roots bilaterally.  Left extraforaminal disc protrusion/osteophyte L5-S1.  He reports that he has been smoking Cigarettes.  He has been smoking about 0.50 packs per day. He has never used smokeless tobacco. No results for input(s): HGBA1C, LABURIC in the last 8760 hours.  Objective:  VS:  HT:    WT:   BMI:     BP:115/85  HR:90bpm  TEMP: ( )  RESP:99 % Physical Exam  Constitutional: He is oriented to person, place, and time. He appears well-developed and well-nourished. No distress.  HENT:  Head: Normocephalic and atraumatic.  Eyes: Pupils are equal, round, and reactive to light. Conjunctivae are normal.  Neck: Normal range of motion. Neck supple.  Cardiovascular: Regular rhythm and intact distal pulses.   Pulmonary/Chest: Effort normal. No respiratory distress.  Musculoskeletal:  Patient ambulates with antalgic gait to the left. He does have a tight hamstring and equivocally positive slump test on the left. He has pain with extension rotation of the lumbar spine. He has no pain with hip rotation is good distal strength.  Neurological: He is alert and oriented to person, place, and time.  Skin: Skin is warm and dry. No rash noted. No erythema.  Psychiatric: He has a normal mood and affect.  Nursing note and vitals reviewed.   Ortho Exam Imaging: No results found.  Past Medical/Family/Surgical/Social History: Medications & Allergies reviewed per EMR Patient Active  Problem List   Diagnosis Date Noted  . Chest pain 09/25/2016   Past Medical History:  Diagnosis Date  . Hypertension    Family History  Problem Relation Age of Onset  . Cancer Mother        Deseased   . Heart attack Father        Deseased   . Hypertension Sister   . CAD Brother        Deseased   . CAD Sister   . Cancer Brother        Deseased    Past Surgical History:  Procedure Laterality Date  . KNEE SURGERY    . SECONDARY CLOSURE ARM     Social History   Occupational History  . Not on file.   Social History Main Topics  . Smoking status: Current Every Day Smoker    Packs/day: 0.50    Types: Cigarettes  . Smokeless tobacco: Never Used  . Alcohol use Yes     Comment: occasional  . Drug use: No     Comment: Cocaine postive in [employer] but denies use  . Sexual activity: Not on file

## 2017-03-26 NOTE — Procedures (Signed)
Lumbosacral Transforaminal Epidural Steroid Injection - Sub-Pedicular Approach with Fluoroscopic Guidance  Patient: Scott QuantClarence Garceau      Date of Birth: 03/28/1951 MRN: [id number] PCP: Patient, No Pcp Per      Visit Date: 03/24/2017   Universal Protocol:    Date/Time: 03/24/2017  Consent Given By: the patient  Position: PRONE  Additional Comments: Vital signs were monitored before and after the procedure. Patient was prepped and draped in the usual sterile fashion. The correct patient, procedure, and site was verified.   Injection Procedure Details:  Procedure Site One Meds Administered:  Meds ordered this encounter  Medications  . lidocaine (PF) (XYLOCAINE) 1 % injection 2 mL  . betamethasone acetate-betamethasone sodium phosphate (CELESTONE) injection 12 mg    Laterality: Left  Location/Site:  L4-L5  Needle size: 22 G  Needle type: Spinal  Needle Placement: Transforaminal  Findings:  -Contrast Used: 1 mL iohexol 180 mg iodine/mL   -Comments: Excellent flow of contrast along the nerve and into the epidural space.  Procedure Details: After squaring off the end-plates to get a true AP view, the C-arm was positioned so that an oblique view of the foramen as noted above was visualized. The target area is just inferior to the "nose of the scotty dog" or sub pedicular. The soft tissues overlying this structure were infiltrated with 2-3 ml. of 1% Lidocaine without Epinephrine.  The spinal needle was inserted toward the target using a "trajectory" view along the fluoroscope beam.  Under AP and lateral visualization, the needle was advanced so it did not puncture dura and was located close the 6 O'Clock position of the pedical in AP tracterory. Biplanar projections were used to confirm position. Aspiration was confirmed to be negative for CSF and/or blood. A 1-2 ml. volume of Isovue-250 was injected and flow of contrast was noted at each level. Radiographs were obtained for  documentation purposes.   After attaining the desired flow of contrast documented above, a 0.5 to 1.0 ml test dose of 0.25% Marcaine was injected into each respective transforaminal space.  The patient was observed for 90 seconds post injection.  After no sensory deficits were reported, and normal lower extremity motor function was noted,   the above injectate was administered so that equal amounts of the injectate were placed at each foramen (level) into the transforaminal epidural space.   Additional Comments:  The patient tolerated the procedure well Dressing: Band-Aid    Post-procedure details: Patient was observed during the procedure. Post-procedure instructions were reviewed.  Patient left the clinic in stable condition.

## 2017-04-06 ENCOUNTER — Telehealth (INDEPENDENT_AMBULATORY_CARE_PROVIDER_SITE_OTHER): Payer: Self-pay | Admitting: Physical Medicine and Rehabilitation

## 2017-04-06 NOTE — Telephone Encounter (Signed)
Left L4-5 interlam esi - tell them different way to do injection to try to get more medication at that level.

## 2017-04-06 NOTE — Telephone Encounter (Signed)
Patient is scheduled for 04/20/17 pending HTA auth. His wife would like to know if you can prescribe anything for pain until then. [facility] [place].

## 2017-04-07 MED ORDER — ACETAMINOPHEN-CODEINE #3 300-30 MG PO TABS: 1.0000 | ORAL_TABLET | Freq: Three times a day (TID) | ORAL | 0 refills | Status: DC | PRN

## 2017-04-07 NOTE — Telephone Encounter (Signed)
Received auth for [id number]. [id number] 04/07/17-07/06/17.

## 2017-04-07 NOTE — Telephone Encounter (Signed)
Submitted auth on [employer]

## 2017-04-07 NOTE — Telephone Encounter (Signed)
Rx faxed and patient's wife advised.

## 2017-04-07 NOTE — Telephone Encounter (Signed)
If he needs stronger then will have to talk with Dr. Hilda Lias. Printed out tylenol#3 to fax

## 2017-04-20 ENCOUNTER — Ambulatory Visit (INDEPENDENT_AMBULATORY_CARE_PROVIDER_SITE_OTHER): Payer: PPO

## 2017-04-20 ENCOUNTER — Encounter (INDEPENDENT_AMBULATORY_CARE_PROVIDER_SITE_OTHER): Payer: Self-pay | Admitting: Physical Medicine and Rehabilitation

## 2017-04-20 ENCOUNTER — Ambulatory Visit (INDEPENDENT_AMBULATORY_CARE_PROVIDER_SITE_OTHER): Payer: PPO | Admitting: Physical Medicine and Rehabilitation

## 2017-04-20 VITALS — BP 108/69 | HR 99 | Temp 98.0°F

## 2017-04-20 DIAGNOSIS — M5416 Radiculopathy, lumbar region: Secondary | ICD-10-CM | POA: Diagnosis not present

## 2017-04-20 MED ORDER — LIDOCAINE HCL (PF) 1 % IJ SOLN
2.0000 mL | Freq: Once | INTRAMUSCULAR | Status: AC
  Administered 2017-04-20: 2 mL

## 2017-04-20 MED ORDER — ACETAMINOPHEN-CODEINE #3 300-30 MG PO TABS: 1.0000 | ORAL_TABLET | Freq: Three times a day (TID) | ORAL | 0 refills | Status: DC | PRN

## 2017-04-20 MED ORDER — METHYLPREDNISOLONE ACETATE 80 MG/ML IJ SUSP
80.0000 mg | Freq: Once | INTRAMUSCULAR | Status: AC
  Administered 2017-04-20: 80 mg

## 2017-04-20 NOTE — Procedures (Signed)
[name] is a 66 year old gentleman with left radicular leg pain and low back pain quite severe. He had a prior left L4 transforaminal epidural steroid injection about 3 weeks ago with a couple days of relief and then the symptoms returned. He has a significant disc herniation at L4-5 which is foraminal and paracentral he probably impacting the lateral recesses and foramen and looks fairly compressed. He has good strength otherwise. Small protrusion at L5-S1. I think given the lack of relief with a transforaminal approach in the tightness of the nerve will try an intralaminar approach. I did discuss with him that if he doesn't get relief for the next 10 days or so to contact Dr. Hilda Ferguson or myself. He may need referral for possible microdiscectomy.  Lumbar Epidural Steroid Injection - Interlaminar Approach with Fluoroscopic Guidance  Patient: Scott Ferguson      Date of Birth: 10/02/1950 MRN: [id number] PCP: Patient, No Pcp Per      Visit Date: 04/20/2017   Universal Protocol:     Consent Given By: the patient  Position: PRONE  Additional Comments: Vital signs were monitored before and after the procedure. Patient was prepped and draped in the usual sterile fashion. The correct patient, procedure, and site was verified.   Injection Procedure Details:  Procedure Site One Meds Administered:  Meds ordered this encounter  Medications  . lidocaine (PF) (XYLOCAINE) 1 % injection 2 mL  . methylPREDNISolone acetate (DEPO-MEDROL) injection 80 mg     Laterality: Left  Location/Site:  L4-L5  Needle size: 20 G  Needle type: Tuohy  Needle Placement: Paramedian epidural  Findings:  -Contrast Used: 1 mL iohexol 180 mg iodine/mL   -Comments: Excellent flow of contrast into the epidural space.  Procedure Details: Using a paramedian approach from the side mentioned above, the region overlying the inferior lamina was localized under fluoroscopic visualization and the soft tissues overlying  this structure were infiltrated with 4 ml. of 1% Lidocaine without Epinephrine. The Tuohy needle was inserted into the epidural space using a paramedian approach.   The epidural space was localized using loss of resistance along with lateral and bi-planar fluoroscopic views.  After negative aspirate for air, blood, and CSF, a 2 ml. volume of Isovue-250 was injected into the epidural space and the flow of contrast was observed. Radiographs were obtained for documentation purposes.    The injectate was administered into the level noted above.   Additional Comments:  The patient tolerated the procedure well Dressing: Band-Aid    Post-procedure details: Patient was observed during the procedure. Post-procedure instructions were reviewed.  Patient left the clinic in stable condition.

## 2017-04-20 NOTE — Addendum Note (Signed)
Addended by: Ashok NorrisNEWTON, Dmani Mizer K on: 04/20/2017 08:48 AM   Modules accepted: Orders

## 2017-04-20 NOTE — Patient Instructions (Signed)

## 2017-04-20 NOTE — Progress Notes (Deleted)
Pain down front of left leg did not improve much with last injection.Also pain in lower back.

## 2017-05-08 ENCOUNTER — Other Ambulatory Visit (INDEPENDENT_AMBULATORY_CARE_PROVIDER_SITE_OTHER): Payer: Self-pay | Admitting: Physical Medicine and Rehabilitation

## 2017-05-08 NOTE — Telephone Encounter (Signed)
Will refill x 1 but if no help with last injection then needs f/up with Hilda Lias or referral to spine surgeon

## 2017-05-08 NOTE — Telephone Encounter (Signed)
Please advise 

## 2017-05-08 NOTE — Telephone Encounter (Signed)
Patient advised.

## 2017-06-12 ENCOUNTER — Encounter: Payer: Self-pay | Admitting: Family Medicine

## 2017-06-12 ENCOUNTER — Emergency Department (HOSPITAL_COMMUNITY): Payer: PPO

## 2017-06-12 ENCOUNTER — Other Ambulatory Visit: Payer: Self-pay

## 2017-06-12 ENCOUNTER — Encounter (HOSPITAL_COMMUNITY): Payer: Self-pay | Admitting: Emergency Medicine

## 2017-06-12 ENCOUNTER — Encounter (HOSPITAL_COMMUNITY): Payer: Self-pay

## 2017-06-12 ENCOUNTER — Inpatient Hospital Stay (HOSPITAL_COMMUNITY)
Admission: EM | Admit: 2017-06-12 | Discharge: 2017-06-19 | DRG: 193 | Disposition: A | Discharge status: Home / Self Care | Payer: PPO | Attending: Internal Medicine | Admitting: Internal Medicine

## 2017-06-12 ENCOUNTER — Ambulatory Visit (INDEPENDENT_AMBULATORY_CARE_PROVIDER_SITE_OTHER): Payer: PPO | Admitting: Family Medicine

## 2017-06-12 VITALS — BP 108/80 | HR 111 | Temp 101.9°F | Resp 20 | Ht 66.0 in | Wt 99.0 lb

## 2017-06-12 DIAGNOSIS — R000 Tachycardia, unspecified: Secondary | ICD-10-CM | POA: Diagnosis not present

## 2017-06-12 DIAGNOSIS — Z79899 Other long term (current) drug therapy: Secondary | ICD-10-CM | POA: Diagnosis not present

## 2017-06-12 DIAGNOSIS — F17210 Nicotine dependence, cigarettes, uncomplicated: Secondary | ICD-10-CM | POA: Diagnosis not present

## 2017-06-12 DIAGNOSIS — R05 Cough: Secondary | ICD-10-CM

## 2017-06-12 DIAGNOSIS — A319 Mycobacterial infection, unspecified: Secondary | ICD-10-CM | POA: Diagnosis not present

## 2017-06-12 DIAGNOSIS — E46 Unspecified protein-calorie malnutrition: Secondary | ICD-10-CM | POA: Diagnosis not present

## 2017-06-12 DIAGNOSIS — Z681 Body mass index (BMI) 19 or less, adult: Secondary | ICD-10-CM | POA: Diagnosis not present

## 2017-06-12 DIAGNOSIS — R059 Cough, unspecified: Secondary | ICD-10-CM

## 2017-06-12 DIAGNOSIS — E43 Unspecified severe protein-calorie malnutrition: Secondary | ICD-10-CM | POA: Diagnosis not present

## 2017-06-12 DIAGNOSIS — R509 Fever, unspecified: Secondary | ICD-10-CM | POA: Diagnosis not present

## 2017-06-12 DIAGNOSIS — A150 Tuberculosis of lung: Secondary | ICD-10-CM | POA: Diagnosis not present

## 2017-06-12 DIAGNOSIS — J189 Pneumonia, unspecified organism: Secondary | ICD-10-CM

## 2017-06-12 DIAGNOSIS — E785 Hyperlipidemia, unspecified: Secondary | ICD-10-CM | POA: Diagnosis present

## 2017-06-12 DIAGNOSIS — J168 Pneumonia due to other specified infectious organisms: Secondary | ICD-10-CM | POA: Diagnosis not present

## 2017-06-12 DIAGNOSIS — E860 Dehydration: Secondary | ICD-10-CM

## 2017-06-12 DIAGNOSIS — R634 Abnormal weight loss: Secondary | ICD-10-CM | POA: Diagnosis not present

## 2017-06-12 DIAGNOSIS — E222 Syndrome of inappropriate secretion of antidiuretic hormone: Secondary | ICD-10-CM | POA: Diagnosis present

## 2017-06-12 DIAGNOSIS — Z880 Allergy status to penicillin: Secondary | ICD-10-CM | POA: Diagnosis not present

## 2017-06-12 DIAGNOSIS — J158 Pneumonia due to other specified bacteria: Secondary | ICD-10-CM

## 2017-06-12 DIAGNOSIS — J181 Lobar pneumonia, unspecified organism: Principal | ICD-10-CM | POA: Diagnosis present

## 2017-06-12 DIAGNOSIS — E871 Hypo-osmolality and hyponatremia: Secondary | ICD-10-CM | POA: Diagnosis not present

## 2017-06-12 DIAGNOSIS — Z7982 Long term (current) use of aspirin: Secondary | ICD-10-CM

## 2017-06-12 DIAGNOSIS — I10 Essential (primary) hypertension: Secondary | ICD-10-CM | POA: Diagnosis present

## 2017-06-12 DIAGNOSIS — R0602 Shortness of breath: Secondary | ICD-10-CM | POA: Diagnosis not present

## 2017-06-12 DIAGNOSIS — A159 Respiratory tuberculosis unspecified: Secondary | ICD-10-CM | POA: Diagnosis present

## 2017-06-12 DIAGNOSIS — Z87891 Personal history of nicotine dependence: Secondary | ICD-10-CM | POA: Diagnosis not present

## 2017-06-12 LAB — CBC WITH DIFFERENTIAL/PLATELET
BASOS PCT: 0 %
Basophils Absolute: 0.1 10*3/uL (ref 0.0–0.1)
EOS ABS: 0 10*3/uL (ref 0.0–0.7)
EOS PCT: 0 %
HCT: 37.1 % — ABNORMAL LOW (ref 39.0–52.0)
Hemoglobin: 11.7 g/dL — ABNORMAL LOW (ref 13.0–17.0)
LYMPHS PCT: 6 %
Lymphs Abs: 0.8 10*3/uL (ref 0.7–4.0)
MCH: 24.1 pg — ABNORMAL LOW (ref 26.0–34.0)
MCHC: 31.5 g/dL (ref 30.0–36.0)
MCV: 76.3 fL — ABNORMAL LOW (ref 78.0–100.0)
MONO ABS: 1.3 10*3/uL — AB (ref 0.1–1.0)
MONOS PCT: 9 %
NEUTROS ABS: 12.3 10*3/uL — AB (ref 1.7–7.7)
NEUTROS PCT: 85 %
PLATELETS: ADEQUATE 10*3/uL (ref 150–400)
RBC: 4.86 MIL/uL (ref 4.22–5.81)
RDW: 15.9 % — AB (ref 11.5–15.5)
WBC: 14.5 10*3/uL — ABNORMAL HIGH (ref 4.0–10.5)

## 2017-06-12 LAB — INFLUENZA PANEL BY PCR (TYPE A & B)
Influenza A By PCR: NEGATIVE
Influenza B By PCR: NEGATIVE

## 2017-06-12 LAB — LACTIC ACID, PLASMA
LACTIC ACID, VENOUS: 1.6 mmol/L (ref 0.5–1.9)
Lactic Acid, Venous: 1.7 mmol/L (ref 0.5–1.9)

## 2017-06-12 LAB — COMPREHENSIVE METABOLIC PANEL
ALBUMIN: 2.9 g/dL — AB (ref 3.5–5.0)
ALT: 20 U/L (ref 17–63)
ANION GAP: 14 (ref 5–15)
AST: 19 U/L (ref 15–41)
Alkaline Phosphatase: 143 U/L — ABNORMAL HIGH (ref 38–126)
BUN: 12 mg/dL (ref 6–20)
CO2: 25 mmol/L (ref 22–32)
Calcium: 9.4 mg/dL (ref 8.9–10.3)
Chloride: 91 mmol/L — ABNORMAL LOW (ref 101–111)
Creatinine, Ser: 0.85 mg/dL (ref 0.61–1.24)
GFR calc Af Amer: >60 mL/min (ref >60)
GFR calc non Af Amer: >60 mL/min (ref >60)
GLUCOSE: 97 mg/dL (ref 65–99)
POTASSIUM: 3.8 mmol/L (ref 3.5–5.1)
SODIUM: 130 mmol/L — AB (ref 135–145)
TOTAL PROTEIN: 7.6 g/dL (ref 6.5–8.1)
Total Bilirubin: 0.6 mg/dL (ref 0.3–1.2)

## 2017-06-12 LAB — PROTIME-INR
INR: 1.06
PROTHROMBIN TIME: 13.7 s (ref 11.4–15.2)

## 2017-06-12 MED ORDER — ENSURE ENLIVE PO LIQD
237.0000 mL | Freq: Two times a day (BID) | ORAL | Status: DC
  Administered 2017-06-13: 237 mL via ORAL

## 2017-06-12 MED ORDER — DEXTROSE 5 % IV SOLN
1.0000 g | Freq: Once | INTRAVENOUS | Status: AC
  Administered 2017-06-12: 1 g via INTRAVENOUS
  Filled 2017-06-12: qty 10

## 2017-06-12 MED ORDER — ENOXAPARIN SODIUM 30 MG/0.3ML ~~LOC~~ SOLN
30.0000 mg | SUBCUTANEOUS | Status: DC
  Administered 2017-06-12 – 2017-06-18 (×7): 30 mg via SUBCUTANEOUS
  Filled 2017-06-12 (×7): qty 0.3

## 2017-06-12 MED ORDER — DEXTROSE 5 % IV SOLN
500.0000 mg | Freq: Once | INTRAVENOUS | Status: AC
  Administered 2017-06-12: 500 mg via INTRAVENOUS
  Filled 2017-06-12: qty 500

## 2017-06-12 MED ORDER — SENNOSIDES-DOCUSATE SODIUM 8.6-50 MG PO TABS: 1.0000 | ORAL_TABLET | Freq: Every evening | ORAL | Status: DC | PRN

## 2017-06-12 MED ORDER — ONDANSETRON HCL 4 MG PO TABS: 4.0000 mg | ORAL_TABLET | Freq: Four times a day (QID) | ORAL | Status: DC | PRN

## 2017-06-12 MED ORDER — DEXTROSE 5 % IV SOLN
1.0000 g | INTRAVENOUS | Status: DC
  Administered 2017-06-13 – 2017-06-16 (×4): 1 g via INTRAVENOUS
  Filled 2017-06-12 (×6): qty 10

## 2017-06-12 MED ORDER — LORAZEPAM 0.5 MG PO TABS
0.5000 mg | ORAL_TABLET | Freq: Once | ORAL | Status: AC
  Administered 2017-06-12: 0.5 mg via ORAL
  Filled 2017-06-12: qty 1

## 2017-06-12 MED ORDER — ACETAMINOPHEN 650 MG RE SUPP: 650.0000 mg | Freq: Four times a day (QID) | RECTAL | Status: DC | PRN

## 2017-06-12 MED ORDER — SODIUM CHLORIDE 0.9 % IV SOLN
INTRAVENOUS | Status: DC
  Administered 2017-06-12 – 2017-06-18 (×8): via INTRAVENOUS

## 2017-06-12 MED ORDER — AZITHROMYCIN 500 MG IV SOLR
500.0000 mg | INTRAVENOUS | Status: DC
  Administered 2017-06-13 – 2017-06-16 (×4): 500 mg via INTRAVENOUS
  Filled 2017-06-12 (×5): qty 500

## 2017-06-12 MED ORDER — ACETAMINOPHEN 325 MG PO TABS
650.0000 mg | ORAL_TABLET | Freq: Four times a day (QID) | ORAL | Status: DC | PRN
  Administered 2017-06-13 – 2017-06-17 (×6): 650 mg via ORAL
  Filled 2017-06-12 (×7): qty 2

## 2017-06-12 MED ORDER — ONDANSETRON HCL 4 MG/2ML IJ SOLN
4.0000 mg | Freq: Four times a day (QID) | INTRAMUSCULAR | Status: DC | PRN
  Administered 2017-06-18: 4 mg via INTRAVENOUS
  Filled 2017-06-12: qty 2

## 2017-06-12 MED ORDER — SODIUM CHLORIDE 0.9 % IV BOLUS (SEPSIS)
500.0000 mL | Freq: Once | INTRAVENOUS | Status: AC
Start: 2017-06-12 — End: 2017-06-12
  Administered 2017-06-12: 500 mL via INTRAVENOUS

## 2017-06-12 NOTE — ED Triage Notes (Signed)
Sent from Dr Tracie HarrierHagler office for fever and shortness of breath for several months  fatigue and loss of appetite. Was at Dr Malena PeerHagler's office for first visit and brought here for work up and "keep him as needed"  Pt lives independently

## 2017-06-12 NOTE — Progress Notes (Signed)
Patient ID: Scott Ferguson, male    DOB: [birth year], 66 y.o.   MRN: [id number]  Chief Complaint  Patient presents with  . Fatigue    Allergies Penicillins  Subjective:   Scott Ferguson is a 66 y.o. male who presents to [facility] today.  HPI [name] presents today as a new patient visit complaining of cough, fatigue, and shortness of breath for the past 2 weeks. He reports that he feels extremely tired and winded even with sitting still. He reports that he has not been able to eat solid food for the past 3 days because he has no appetite. Reports that he has cut down on smoking cigarettes over the past several days because he has been feeling short of breath with even sitting still. Reports he is extremely tired and has no energy. He does live alone, but his sister down the street cooks for him. Reports has lost a lot of weight recently.  In review of chart has lost over 10 pounds in the past several months. Reorts he is just not been feeling like his normals self recently. He denies any chest pain, nausea, vomiting, or diarrhea. He reports that he did have a bad  nose bleed last week, and that this is not normal.  Reports that he has had a productive cough for 2 weeks. He has used OTC meds and they are not helping. Reports that he has not taken his blood pressure medications in several days because he has felt so bad.   No current facility-administered medications on file prior to visit.    Current Outpatient Prescriptions on File Prior to Visit  Medication Sig Dispense Refill  . acetaminophen-codeine (TYLENOL #3) 300-30 MG tablet TAKE 1 TABLET BY MOUTH EVERY 8 HOURS AS NEEDED FOR MODERATE PAIN (Patient not taking: Reported on 06/12/2017) 21 tablet 0  . amLODipine (NORVASC) 5 MG tablet Take 1 tablet (5 mg total) by mouth daily. (Patient not taking: Reported on 06/12/2017) 30 tablet 0  . aspirin EC 81 MG tablet Take 1 tablet (81 mg total) by mouth daily. (Patient not  taking: Reported on 06/12/2017) 30 tablet 0  . atorvastatin (LIPITOR) 40 MG tablet Take 1 tablet (40 mg total) by mouth daily at 6 PM. (Patient not taking: Reported on 06/12/2017) 30 tablet 0  . HYDROcodone-acetaminophen (NORCO/VICODIN) 5-325 MG tablet Take 1 tablet by mouth every 4 (four) hours as needed for moderate pain (Must last 14 days.Do not take and drive a car or use machinery.). (Patient not taking: Reported on 06/12/2017) 56 tablet 0  . naproxen (NAPROSYN) 500 MG tablet Take 1 tablet (500 mg total) by mouth 2 (two) times daily with a meal. (Patient not taking: Reported on 06/12/2017) 60 tablet 0  . nitroGLYCERIN (NITROSTAT) 0.4 MG SL tablet Place 1 tablet (0.4 mg total) under the tongue every 5 (five) minutes as needed for chest pain. (Patient not taking: Reported on 06/12/2017) 30 tablet 0    Past Medical History:  Diagnosis Date  . Hyperlipidemia   . Hypertension     Past Surgical History:  Procedure Laterality Date  . KNEE SURGERY    . SECONDARY CLOSURE ARM      Family History  Problem Relation Age of Onset  . Cancer Mother        Deseased   . Heart attack Father        Deseased   . Hypertension Sister   . CAD Brother  Deseased   . CAD Sister   . Cancer Brother        Deseased      Social History   Social History  . Marital status: Divorced    Spouse name: N/A  . Number of children: N/A  . Years of education: N/A   Social History Main Topics  . Smoking status: Current Every Day Smoker    Packs/day: 0.50    Types: Cigarettes  . Smokeless tobacco: Never Used  . Alcohol use Yes     Comment: occasional  . Drug use: No  . Sexual activity: No   Other Topics Concern  . None   Social History Narrative  . None    Review of Systems  Constitutional: Positive for activity change, appetite change, chills, diaphoresis, fatigue, fever and unexpected weight change.  HENT: Positive for nosebleeds. Negative for congestion, dental problem, ear discharge,  rhinorrhea, trouble swallowing and voice change.   Eyes: Negative for visual disturbance.  Respiratory: Positive for cough, chest tightness, shortness of breath and wheezing.   Cardiovascular: Negative for chest pain, palpitations and leg swelling.  Gastrointestinal: Negative for abdominal pain, constipation, diarrhea and vomiting.  Endocrine: Negative for polydipsia and polyphagia.  Genitourinary: Negative for decreased urine volume, dysuria, frequency, hematuria and urgency.  Musculoskeletal: Positive for back pain.  Skin: Negative for rash.  Neurological: Negative for dizziness, weakness, numbness and headaches.  Psychiatric/Behavioral: The patient is not nervous/anxious.      Objective:   BP 108/80 (BP Location: Left Arm, Patient Position: Sitting, Cuff Size: Normal)   Pulse (!) 111   Temp (!) 101.9 F (38.8 C) (Other (Comment))   Resp 20   Ht 5\' 6"  (1.676 m)   Wt 99 lb (44.9 kg)   SpO2 90%   BMI 15.98 kg/m   Physical Exam  Constitutional: He is oriented to person, place, and time. He appears cachectic. He is cooperative. He has a sickly appearance. He appears ill.  HENT:  Head: Normocephalic and atraumatic. Head is without right periorbital erythema and without left periorbital erythema.  Eyes: Pupils are equal, round, and reactive to light. Conjunctivae, EOM and lids are normal. Right conjunctiva is not injected. Left conjunctiva is not injected. No scleral icterus.  Neck: Trachea normal and normal range of motion. Neck supple. Normal carotid pulses and no hepatojugular reflux present. No neck rigidity. No edema and no erythema present.  Cardiovascular: Normal heart sounds and normal pulses.  Tachycardia present.   Pulmonary/Chest: Accessory muscle usage present. He is in respiratory distress. He has wheezes in the right upper field, the right middle field, the right lower field, the left upper field, the left middle field and the left lower field.  Abdominal: Bowel sounds  are normal. There is no hepatosplenomegaly.  Scaphoid abdomen.  Neurological: He is alert and oriented to person, place, and time. He is not disoriented. No cranial nerve deficit or sensory deficit. Coordination normal.  Skin: Skin is warm and dry. No rash noted.  Psychiatric: His speech is normal and behavior is normal.  Vitals reviewed.    Assessment and Plan  66 year old malnourished African-American male presenting as a new patient visit with cough and shortness of breath over the past 2 weeks with associated tachycardia, fever, weight loss, dehydration, and normal  low/low blood pressure in the setting of previously hypertensive patient. At this time believe the patient needs further evaluation with radiologic tests, labs, and possible IV fluids. Patient also has questionable lack of social  support systems. He reports that he does not have a way to get to the emergency department today but could walk over there from our office. Patient willing to go to the emergency department because he reports he has not felt this bad in his long as he can remember. Patient taken to emergency department across the street in a wheelchair by our nurse. He was received at emergency department where he is to receive care.  1. Cough Uncertain whether due to pneumonia, bronchitis or COPD exacerbation. Needs chest x-ray at this time for further evaluation. Pulse oximetry at rest 89-90% on room air. However patient obviously symptomatic. Patient with diffuse wheezes and definitely needs nebulization treatment, however heart rate is greater than 120 at this time.  2. Shortness of breath Uncertain whether shortness of breath is related to pulmonary or cardiac manifestations. Patient needs evaluation with CBC to check his anemia to make sure oxygen carrying capacity of blood is within normal limits and shortness of breath not worsened by anemia.  3. Fever, unspecified fever cause Suspect due to viral versus bacterial  process.  4. Malnutrition, unspecified type (HCC) Patient obviously has chronic low body mass index, but review of chart reveals greater than 10 pound loss of weight in the past couple months. Additional lab testing and follow-up needed. Questionable social services involvement.  5. Dehydration Unable/has not eaten in several days with questionable fluid intake. Blood pressures borderline in the setting of prior hypertension. Needs IV fluids.  6. Weight loss Uncertainty as to whether this is due to inadequate calorie intake versus hypermetabolic state. Needs thyroid and other labs performed in addition to questionable evaluation for malignancy.  7. Tachycardia Questionable due to fever versus dehydration versus cardiac abnormality. Needs evaluation and workup.   Return after ED visit. Aliene Beamsachel Natelie Ostrosky, MD 06/12/2017

## 2017-06-12 NOTE — ED Provider Notes (Signed)
Emergency Department Provider Note   I have reviewed the triage vital signs and the nursing notes.   HISTORY  Chief Complaint Cough   HPI Scott Ferguson is a 66 y.o. male with PMH of HLD and HTN patient shortness of breath.  Patient states that this is been worsening over the last 2 weeks.  He has had significant fatigue, loss of appetite, unintentional weight loss.  He endorses some soaking sweats at night.  He has had significant difficulty breathing a occasionally productive cough.  No hemoptysis.  Patient saw his primary care physician in the office today and was referred to the emergency department secondary to fever and tachycardia with the above symptoms.  He denies any high risk TB exposures such as prisons or homeless shelters.  He states that over the last 2 weeks he estimates he is lost 10 pounds without trying.    Past Medical History:  Diagnosis Date  . Hyperlipidemia   . Hypertension     Patient Active Problem List   Diagnosis Date Noted  . Lobar pneumonia (HCC) 06/12/2017  . Chest pain 09/25/2016    Past Surgical History:  Procedure Laterality Date  . KNEE SURGERY    . SECONDARY CLOSURE ARM      Current Outpatient Rx  . Order #: [id number] Class: Print  . Order #: [id number] Class: Normal  . Order #: [id number] Class: Normal  . Order #: [id number] Class: Normal  . Order #: [id number] Class: Print  . Order #: [id number] Class: Print  . Order #: [id number] Class: Normal    Allergies Penicillins  Family History  Problem Relation Age of Onset  . Cancer Mother        Deseased   . Heart attack Father        Deseased   . Hypertension Sister   . CAD Brother        Deseased   . CAD Sister   . Cancer Brother        Deseased     Social History Social History  Substance Use Topics  . Smoking status: Current Every Day Smoker    Packs/day: 0.50    Types: Cigarettes  . Smokeless tobacco: Never Used  . Alcohol use Yes     Comment: occasional    Review of  Systems  Constitutional: Positive fever/chills and worsening generalized weakness. Positive unintentional weight loss and soaking sweats at times.  Eyes: No visual changes. ENT: No sore throat. Cardiovascular: Denies chest pain. Respiratory: Positive shortness of breath and cough.  Gastrointestinal: No abdominal pain.  No nausea, no vomiting.  No diarrhea.  No constipation. Genitourinary: Negative for dysuria. Musculoskeletal: Negative for back pain. Skin: Negative for rash. Neurological: Negative for headaches, focal weakness or numbness.  10-point ROS otherwise negative.  ____________________________________________   PHYSICAL EXAM:  VITAL SIGNS: ED Triage Vitals [06/12/17 1153]  Enc Vitals Group     BP 93/77     Pulse Rate (!) 111     Resp 18     Temp 99.4 F (37.4 C)     Temp Source Oral     SpO2 95 %     Weight 99 lb (44.9 kg)     Height 5\' 6"  (1.676 m)     Pain Score 7   Constitutional: Alert and oriented. Well appearing and in no acute distress. Eyes: Conjunctivae are normal.  Head: Atraumatic. Nose: No congestion/rhinnorhea. Mouth/Throat: Mucous membranes are slightly dry.  Neck: No stridor.  Cardiovascular: Normal rate, regular rhythm. Good peripheral circulation. Grossly  normal heart sounds.   Respiratory: Normal respiratory effort.  No retractions. Lungs CTAB. Gastrointestinal: Soft and nontender. No distention.  Musculoskeletal: No lower extremity tenderness nor edema. No gross deformities of extremities. Neurologic:  Normal speech and language. No gross focal neurologic deficits are appreciated.  Skin:  Skin is warm, dry and intact. No rash noted.  ____________________________________________   LABS (all labs ordered are listed, but only abnormal results are displayed)  Labs Reviewed  COMPREHENSIVE METABOLIC PANEL - Abnormal; Notable for the following:       Result Value   Sodium 130 (*)    Chloride 91 (*)    Albumin 2.9 (*)    Alkaline  Phosphatase 143 (*)    All other components within normal limits  CBC WITH DIFFERENTIAL/PLATELET - Abnormal; Notable for the following:    WBC 14.5 (*)    Hemoglobin 11.7 (*)    HCT 37.1 (*)    MCV 76.3 (*)    MCH 24.1 (*)    RDW 15.9 (*)    Neutro Abs 12.3 (*)    Monocytes Absolute 1.3 (*)    All other components within normal limits  CULTURE, BLOOD (ROUTINE X 2)  CULTURE, BLOOD (ROUTINE X 2)  PROTIME-INR  LACTIC ACID, PLASMA  URINALYSIS, ROUTINE W REFLEX MICROSCOPIC  HIV ANTIBODY (ROUTINE TESTING)  LACTIC ACID, PLASMA   ____________________________________________  EKG   EKG Interpretation  Date/Time:  Friday June 12 2017 12:56:44 EDT Ventricular Rate:  104 PR Interval:    QRS Duration: 72 QT Interval:  385 QTC Calculation: 507 R Axis:   94 Text Interpretation:  Sinus tachycardia Atrial premature complexes Anterior infarct, old Prolonged QT interval No STEMI.  Confirmed by Alona BeneLong, Katelynne Revak [phone] on 06/12/2017 1:14:32 PM       ____________________________________________  RADIOLOGY  Dg Chest 2 View  Result Date: 06/12/2017 CLINICAL DATA:  COUGH X SEVERAL MONTHS, SOB AND FEVER X COUPLE WEEKS, HTN, SMOKER 1/2 PK/DAY EXAM: CHEST  2 VIEW COMPARISON:  09/25/2016 FINDINGS: Since prior exam, bilateral patchy airspace and coarse interstitial type opacities have developed in both upper lobes and in the left lower lung. This involves the left upper lobe to the greatest degree. Lungs are hyperexpanded.  No pleural effusion or pneumothorax. Cardiac silhouette is normal in size. Left hilum is partly obscured by the contiguous upper lobe opacity. No convincing mediastinal or right hilar masses or adenopathy. Skeletal structures are demineralized but grossly intact. IMPRESSION: 1. New bilateral upper lobe and left lower lobe airspace and interstitial opacities consistent with multifocal pneumonia. Opportunistic infections including tuberculosis should be considered in the differential  diagnosis. Electronically Signed   By: Amie Portlandavid  Ormond M.D.   On: 06/12/2017 12:24    ____________________________________________   PROCEDURES  Procedure(s) performed:   Procedures  None ____________________________________________   INITIAL IMPRESSION / ASSESSMENT AND PLAN / ED COURSE  Pertinent labs & imaging results that were available during my care of the patient were reviewed by me and considered in my medical decision making (see chart for details).  Patient presents to the emergency department for evaluation of fever, shortness of breath, fatigue, loss of appetite.  Symptoms seem to have been building over the last several weeks.  On arrival he is on room air but tachycardic.  Does have a fever.  He shows bilateral upper lobe airspace opacities consistent with either multifocal pneumonia but also considering tuberculosis as a possibility.  After this.  I placed the patient on airborne precautions and he was moved to  a negative pressure room.  Plan to start on abx to cover CAP at this time and follow labs.   Discussed patient's case with Hospitalist, Dr. Ardyth Harps to request admission. Patient and family (if present) updated with plan. Care transferred to Hospitalist service.  I reviewed all nursing notes, vitals, pertinent old records, EKGs, labs, imaging (as available).  ____________________________________________  FINAL CLINICAL IMPRESSION(S) / ED DIAGNOSES  Final diagnoses:  Pneumonia of both upper lobes due to infectious organism [facility])     MEDICATIONS GIVEN DURING THIS VISIT:  Medications  cefTRIAXone (ROCEPHIN) 1 g in dextrose 5 % 50 mL IVPB (0 g Intravenous Stopped 06/12/17 1518)  azithromycin (ZITHROMAX) 500 mg in dextrose 5 % 250 mL IVPB (500 mg Intravenous New Bag/Given 06/12/17 1411)  sodium chloride 0.9 % bolus 500 mL (500 mLs Intravenous New Bag/Given 06/12/17 1358)     NEW OUTPATIENT MEDICATIONS STARTED DURING THIS VISIT:  None   Note:  This  document was prepared using Dragon voice recognition software and may include unintentional dictation errors.  Alona Bene, MD Emergency Medicine    Laymon Stockert, Arlyss Repress, MD 06/12/17 [phone]

## 2017-06-12 NOTE — H&P (Signed)
History and Physical    Scott QuantClarence Dougher ZOX:[id number] DOB: 12/21/1950 DOA: 06/12/2017  Referring MD/NP/PA: Alona BeneJoshua Long, EDP PCP: Aliene BeamsHagler, [name], MD  Patient coming from: Home  Chief Complaint: Cough, shortness of breath, weakness, weight loss  HPI: Scott Ferguson is a 66 y.o. male with prior history of hypertension and hyperlipidemia who has not been taking medications for at least a couple months comes in today with a progressive 1 week history of above symptoms.  He states he has unintentionally lost 10 pounds in 1 week due to anorexia.  He also describes 2 low-grade fevers subjective chills as well as night sweats.  He has been coughing white sputum and has been short of breath especially with ambulation.  Denies any chest pain.  In the ED he was febrile to 101.9, otherwise hemodynamically stable, labs show a sodium of 130, WBC count of 14.5.  Chest x-ray shows new bilateral upper lobe and left lower lobe airspace and interstitial opacities consistent with multifocal pneumonia.  Opportunistic infections including tuberculosis should be considered.  Admission has been requested.  Past Medical/Surgical History: Past Medical History:  Diagnosis Date  . Hyperlipidemia   . Hypertension     Past Surgical History:  Procedure Laterality Date  . KNEE SURGERY    . SECONDARY CLOSURE ARM      Social History:  reports that he has been smoking Cigarettes.  He has been smoking about 0.50 packs per day. He has never used smokeless tobacco. He reports that he drinks alcohol. He reports that he does not use drugs.  Allergies: Allergies  Allergen Reactions  . Penicillins Other (See Comments)    Childhood reaction. States that he can only take orally and not by injection    Family History:  Family History  Problem Relation Age of Onset  . Cancer Mother        Deseased   . Heart attack Father        Deseased   . Hypertension Sister   . CAD Brother        Deseased   . CAD Sister   .  Cancer Brother        Deseased     Prior to Admission medications   Medication Sig Start Date End Date Taking? Authorizing Provider  acetaminophen-codeine (TYLENOL #3) 300-30 MG tablet TAKE 1 TABLET BY MOUTH EVERY 8 HOURS AS NEEDED FOR MODERATE PAIN Patient not taking: Reported on 06/12/2017 05/08/17   Tyrell AntonioNewton, Frederic, MD  amLODipine (NORVASC) 5 MG tablet Take 1 tablet (5 mg total) by mouth daily. Patient not taking: Reported on 06/12/2017 09/26/16   Albertine GratesXu, Fang, MD  aspirin EC 81 MG tablet Take 1 tablet (81 mg total) by mouth daily. Patient not taking: Reported on 06/12/2017 09/26/16   Albertine GratesXu, Fang, MD  atorvastatin (LIPITOR) 40 MG tablet Take 1 tablet (40 mg total) by mouth daily at 6 PM. Patient not taking: Reported on 06/12/2017 09/26/16   Albertine GratesXu, Fang, MD  HYDROcodone-acetaminophen (NORCO/VICODIN) 5-325 MG tablet Take 1 tablet by mouth every 4 (four) hours as needed for moderate pain (Must last 14 days.Do not take and drive a car or use machinery.). Patient not taking: Reported on 06/12/2017 03/05/17   Darreld McleanKeeling, Wayne, MD  naproxen (NAPROSYN) 500 MG tablet Take 1 tablet (500 mg total) by mouth 2 (two) times daily with a meal. Patient not taking: Reported on 06/12/2017 03/05/16   Vickki HearingHarrison, Stanley E, MD  nitroGLYCERIN (NITROSTAT) 0.4 MG SL tablet Place 1 tablet (0.4 mg total) under  the tongue every 5 (five) minutes as needed for chest pain. Patient not taking: Reported on 06/12/2017 09/26/16   Albertine Grates, MD    Review of Systems:  Constitutional: Positive for fever, chills, diaphoresis, appetite change and fatigue.  HEENT: Denies photophobia, eye pain, redness, hearing loss, ear pain, congestion, sore throat, rhinorrhea, sneezing, mouth sores, trouble swallowing, neck pain, neck stiffness and tinnitus.   Respiratory: Denies  chest tightness,  and wheezing.   Cardiovascular: Denies chest pain, palpitations and leg swelling.  Gastrointestinal: Denies nausea, vomiting, abdominal pain, diarrhea, constipation,  blood in stool and abdominal distention.  Genitourinary: Denies dysuria, urgency, frequency, hematuria, flank pain and difficulty urinating.  Endocrine: Denies: hot or cold intolerance, changes in hair or nails, polyuria, polydipsia. Musculoskeletal: Denies myalgias, back pain, joint swelling, arthralgias and gait problem.  Skin: Denies pallor, rash and wound.  Neurological: Denies dizziness, seizures, syncope, weakness, light-headedness, numbness and headaches.  Hematological: Denies adenopathy. Easy bruising, personal or family bleeding history  Psychiatric/Behavioral: Denies suicidal ideation, mood changes, confusion, nervousness, sleep disturbance and agitation    Physical Exam: Vitals:   06/12/17 1153 06/12/17 1256  BP: 93/77 (!) 128/107  Pulse: (!) 111 (!) 105  Resp: 18 19  Temp: 99.4 F (37.4 C)   TempSrc: Oral   SpO2: 95% 96%  Weight: 44.9 kg (99 lb)   Height: 5\' 6"  (1.676 m)      Constitutional: NAD, calm, comfortable, looks chronically ill Eyes: PERRL, lids and conjunctivae normal ENMT: Mucous membranes are  dry. Posterior pharynx clear of any exudate or lesions.Normal dentition.  Neck: normal, supple, no masses, no thyromegaly Respiratory: clear to auscultation bilaterally, no wheezing, no crackles. Normal respiratory effort. No accessory muscle use.  Cardiovascular: Regular rate and rhythm, no murmurs / rubs / gallops. No extremity edema. 2+ pedal pulses. No carotid bruits.  Abdomen: no tenderness, no masses palpated. No hepatosplenomegaly. Bowel sounds positive.  Musculoskeletal: no clubbing / cyanosis. No joint deformity upper and lower extremities. Good ROM, no contractures. Normal muscle tone.  Skin: no rashes, lesions, ulcers. No induration Neurologic: CN 2-12 grossly intact. Sensation intact, DTR normal. Strength 5/5 in all 4.  Psychiatric: Normal judgment and insight. Alert and oriented x 3. Normal mood.    Labs on Admission: I have personally reviewed the  following labs and imaging studies  CBC:  Recent Labs Lab 06/12/17 1236  WBC 14.5*  NEUTROABS 12.3*  HGB 11.7*  HCT 37.1*  MCV 76.3*  PLT PLATELET CLUMPS NOTED ON SMEAR, COUNT APPEARS ADEQUATE   Basic Metabolic Panel:  Recent Labs Lab 06/12/17 1236  NA 130*  K 3.8  CL 91*  CO2 25  GLUCOSE 97  BUN 12  CREATININE 0.85  CALCIUM 9.4   GFR: Estimated Creatinine Clearance: 53.6 mL/min (by C-G formula based on SCr of 0.85 mg/dL). Liver Function Tests:  Recent Labs Lab 06/12/17 1236  AST 19  ALT 20  ALKPHOS 143*  BILITOT 0.6  PROT 7.6  ALBUMIN 2.9*   No results for input(s): LIPASE, AMYLASE in the last 168 hours. No results for input(s): AMMONIA in the last 168 hours. Coagulation Profile:  Recent Labs Lab 06/12/17 1236  INR 1.06   Cardiac Enzymes: No results for input(s): CKTOTAL, CKMB, CKMBINDEX, TROPONINI in the last 168 hours. BNP (last 3 results) No results for input(s): PROBNP in the last 8760 hours. HbA1C: No results for input(s): HGBA1C in the last 72 hours. CBG: No results for input(s): GLUCAP in the last 168 hours. Lipid Profile: No  results for input(s): CHOL, HDL, LDLCALC, TRIG, CHOLHDL, LDLDIRECT in the last 72 hours. Thyroid Function Tests: No results for input(s): TSH, T4TOTAL, FREET4, T3FREE, THYROIDAB in the last 72 hours. Anemia Panel: No results for input(s): VITAMINB12, FOLATE, FERRITIN, TIBC, IRON, RETICCTPCT in the last 72 hours. Urine analysis: No results found for: COLORURINE, APPEARANCEUR, LABSPEC, PHURINE, GLUCOSEU, HGBUR, BILIRUBINUR, KETONESUR, PROTEINUR, UROBILINOGEN, NITRITE, LEUKOCYTESUR Sepsis Labs: @LABRCNTIP (procalcitonin:4,lacticidven:4) ) Recent Results (from the past 240 hour(s))  Culture, blood (Routine x 2)     Status: None (Preliminary result)   Collection Time: 06/12/17 12:38 PM  Result Value Ref Range Status   Specimen Description RIGHT ANTECUBITAL  Final   Special Requests   Final    BOTTLES DRAWN AEROBIC AND  ANAEROBIC Blood Culture adequate volume   Culture PENDING  Incomplete   Report Status PENDING  Incomplete  Culture, blood (Routine x 2)     Status: None (Preliminary result)   Collection Time: 06/12/17 12:40 PM  Result Value Ref Range Status   Specimen Description BLOOD RIGHT ARM  Final   Special Requests   Final    BOTTLES DRAWN AEROBIC AND ANAEROBIC Blood Culture results may not be optimal due to an inadequate volume of blood received in culture bottles   Culture PENDING  Incomplete   Report Status PENDING  Incomplete     Radiological Exams on Admission: Dg Chest 2 View  Result Date: 06/12/2017 CLINICAL DATA:  COUGH X SEVERAL MONTHS, SOB AND FEVER X COUPLE WEEKS, HTN, SMOKER 1/2 PK/DAY EXAM: CHEST  2 VIEW COMPARISON:  09/25/2016 FINDINGS: Since prior exam, bilateral patchy airspace and coarse interstitial type opacities have developed in both upper lobes and in the left lower lung. This involves the left upper lobe to the greatest degree. Lungs are hyperexpanded.  No pleural effusion or pneumothorax. Cardiac silhouette is normal in size. Left hilum is partly obscured by the contiguous upper lobe opacity. No convincing mediastinal or right hilar masses or adenopathy. Skeletal structures are demineralized but grossly intact. IMPRESSION: 1. New bilateral upper lobe and left lower lobe airspace and interstitial opacities consistent with multifocal pneumonia. Opportunistic infections including tuberculosis should be considered in the differential diagnosis. Electronically Signed   By: Amie Portland M.D.   On: 06/12/2017 12:24    EKG: Independently reviewed.  Sinus tachycardia, no apparent acute ischemic changes  Assessment/Plan Principal Problem:   Lobar pneumonia (HCC) Active Problems:   Protein-calorie malnutrition, severe (HCC)    Multilobar pneumonia -With bilateral upper lobe airspace opacities and constitutional symptoms including fever, night sweats and cough, I do believe we need to  rule out TB. -Patient will be placed in negative pressure room, will check AFB smear and sputum x3. -However, common things being, this most likely represents community-acquired pneumonia and has been started on Rocephin and azithromycin. -Blood and sputum cultures have been requested. -Legionella and strep pneumo urinary antigen have also been requested. -Check influenza PCR.  Severe protein caloric malnutrition -Due to acute illness. -Ensure twice daily, dietitian consultation.   DVT prophylaxis: Lovenox Code Status: Full code Family Communication: Patient only Disposition Plan: Pending medical stability Consults called: None Admission status: Inpatient   Time Spent: 75 minutes  Chaya Jan MD [facility] Pager [phone]  If 7PM-7AM, please contact night-coverage [email or web address] Password [facility]  06/12/2017, 3:28 PM

## 2017-06-13 LAB — HIV ANTIBODY (ROUTINE TESTING W REFLEX): HIV Screen 4th Generation wRfx: NONREACTIVE

## 2017-06-13 LAB — STREP PNEUMONIAE URINARY ANTIGEN: STREP PNEUMO URINARY ANTIGEN: NEGATIVE

## 2017-06-13 MED ORDER — ENSURE ENLIVE PO LIQD
237.0000 mL | Freq: Three times a day (TID) | ORAL | Status: DC
  Administered 2017-06-13 – 2017-06-19 (×18): 237 mL via ORAL

## 2017-06-13 MED ORDER — ADULT MULTIVITAMIN W/MINERALS CH
1.0000 | ORAL_TABLET | Freq: Every day | ORAL | Status: DC
  Administered 2017-06-13 – 2017-06-19 (×7): 1 via ORAL
  Filled 2017-06-13 (×7): qty 1

## 2017-06-13 MED ORDER — LORAZEPAM 0.5 MG PO TABS
0.5000 mg | ORAL_TABLET | Freq: Every evening | ORAL | Status: DC | PRN
  Administered 2017-06-13 – 2017-06-18 (×6): 0.5 mg via ORAL
  Filled 2017-06-13 (×6): qty 1

## 2017-06-13 NOTE — Progress Notes (Signed)
Initial Nutrition Assessment  DOCUMENTATION CODES:  Underweight  INTERVENTION:  Ensure Enlive po TID, each supplement provides 350 kcal and 20 grams of protein  MVI with minerals.   RD to follow up next week   NUTRITION DIAGNOSIS:  Inadequate oral intake related to poor appetite, acute illness (Multifocal PNA) as evidenced by loss of 12% bw in <4 months  GOAL:  Patient will meet greater than or equal to 90% of their needs  MONITOR:  PO intake, Supplement acceptance, Labs, Weight trends, Skin  REASON FOR ASSESSMENT:  Malnutrition Screening Tool    ASSESSMENT:  66 y/o male PMHx HTN, HLD. Has not been taking medicine for a couple months. Presented with cough, SOB, weakness, Weight loss. Reported loss of 10 lbs x 1-2 week due to poor appetite. Also endorses fevers, night  Sweats, productive cough. Worked up for likely multifocal PNA. TB considered.   RD operating remotely on Weekends. Minimal information available at this time. He had presented to a PCP as new patient yesterday. At that appointment, he reported being extremely fatigued, even when not moving. He has not been able to eat solid food x 3 days due to anorexia.   Per review of weight history, it appears the patients UBW for the past year HAD been 115-120. More recently the patient was weighing 109-114 lbs in July. He presents to the hospital at 96 lbs, which is a loss of 13 lbs, 12% bw, in <4 months.   With patients objective weight loss and report of minimal intake for at least the past week, would qualify for severely malnourished in acute context. However, unable to diagnose without visualizing patient.    Labs: WBC 14.5, Albumin: 2.9, Alk Phos: 143,  Meds: Ensure Enlive, IV abx, IVF,    Recent Labs Lab 06/12/17 1236  NA 130*  K 3.8  CL 91*  CO2 25  BUN 12  CREATININE 0.85  CALCIUM 9.4  GLUCOSE 97   NUTRITION - FOCUSED PHYSICAL EXAM: Unable to conduct  Diet Order:  Diet regular Room service appropriate?  Yes; Fluid consistency: Thin  EDUCATION NEEDS:  No education needs have been identified at this time  Skin:  Skin Assessment: Reviewed RN Assessment  Last BM:  11/2  Height:  Ht Readings from Last 1 Encounters:  06/12/17 _0  (1.676 m)   Weight:  Wt Readings from Last 1 Encounters:  06/12/17 96 lb 3.2 oz (43.6 kg)   Wt Readings from Last 10 Encounters:  06/12/17 96 lb 3.2 oz (43.6 kg)  06/12/17 99 lb (44.9 kg)  03/05/17 109 lb (49.4 kg)  02/24/17 114 lb (51.7 kg)  09/25/16 120 lb (54.4 kg)  03/05/16 120 lb (54.4 kg)  02/06/16 116 lb (52.6 kg)  01/09/16 116 lb (52.6 kg)  01/08/16 140 lb (63.5 kg)  02/05/13 145 lb (65.8 kg)   Ideal Body Weight:  64.55 kg  BMI:  Body mass index is 15.53 kg/m.  Estimated Nutritional Needs:  Kcal:  1550-1750 kcals (35-40 kcals/kg)  Protein:  60-70 (1.4-1.6g/kg bw) Fluid:  >1.3 L  Burtis Junes RD, LDN, CNSC Clinical Nutrition Pager: [phone] 06/13/2017 2:10 PM

## 2017-06-13 NOTE — Progress Notes (Signed)
PROGRESS NOTE    Scott Ferguson  XWR:[id number] DOB: [birth year] DOA: 06/12/2017 PCP: Aliene Beams, MD     Brief Narrative:  66 year old man admitted from home on 11/2 with cough, weight loss, fever and night sweats.  Found to have multifocal pneumonia, predominantly in bilateral upper bases which gives concern for tuberculosis.  Admission requested.   Assessment & Plan:   Principal Problem:   Lobar pneumonia (HCC) Active Problems:   Protein-calorie malnutrition, severe (HCC)   Multilobar pneumonia -Bilateral upper airspace opacities and constitutional symptoms are concerning for tuberculosis. -First AFB smear has been collected with results pending.  Will need at least 3 AFB smears negative before we can release him from airborne isolation. -However, common things being common this most likely represents community-acquired pneumonia and will continue Rocephin and azithromycin, culture data is currently pending. -He was febrile this morning to 101, continue to follow. -Influenza PCR is negative  Severe protein caloric malnutrition -Start Ensure, appreciate dietitian recommendations.   DVT prophylaxis: Lovenox Code Status: Full code  Family Communication: Patient only Disposition Plan: Pending medical stability  Consultants:   None  Procedures:   None  Antimicrobials:  Anti-infectives    Start     Dose/Rate Route Frequency Ordered Stop   06/13/17 1500  azithromycin (ZITHROMAX) 500 mg in dextrose 5 % 250 mL IVPB     500 mg 250 mL/hr over 60 Minutes Intravenous Every 24 hours 06/12/17 1823 06/20/17 1459   06/13/17 1400  cefTRIAXone (ROCEPHIN) 1 g in dextrose 5 % 50 mL IVPB     1 g 100 mL/hr over 30 Minutes Intravenous Every 24 hours 06/12/17 1823 06/20/17 1359   06/12/17 1300  cefTRIAXone (ROCEPHIN) 1 g in dextrose 5 % 50 mL IVPB     1 g 100 mL/hr over 30 Minutes Intravenous  Once 06/12/17 1254 06/12/17 1518   06/12/17 1300  azithromycin (ZITHROMAX) 500 mg in  dextrose 5 % 250 mL IVPB     500 mg 250 mL/hr over 60 Minutes Intravenous  Once 06/12/17 1254 06/12/17 1511       Subjective: Feels a little improved with less cough overnight, still feels very weak.  Objective: Vitals:   06/12/17 2105 06/12/17 2116 06/13/17 0530 06/13/17 1606  BP:  (!) 92/59 (!) 94/59 (!) 99/59  Pulse:  (!) 103 98 (!) 106  Resp:  20 20 20   Temp:  98.2 F (36.8 C) (!) 101.1 F (38.4 C) 99 F (37.2 C)  TempSrc:  Oral Oral Oral  SpO2: 95% 97%  95%  Weight:      Height:        Intake/Output Summary (Last 24 hours) at 06/13/17 1806 Last data filed at 06/13/17 1606  Gross per 24 hour  Intake          1108.75 ml  Output              950 ml  Net           158.75 ml   Filed Weights   06/12/17 1153 06/12/17 1553 06/12/17 1816  Weight: 44.9 kg (99 lb) 58.9 kg (129 lb 13.6 oz) 43.6 kg (96 lb 3.2 oz)    Examination:  General exam: Alert, awake, oriented x 3 Respiratory system: Clear to auscultation. Respiratory effort normal. Cardiovascular system:RRR. No murmurs, rubs, gallops. Gastrointestinal system: Abdomen is nondistended, soft and nontender. No organomegaly or masses felt. Normal bowel sounds heard. Central nervous system: Alert and oriented. No focal neurological deficits. Extremities: No C/C/E, +pedal  pulses Skin: No rashes, lesions or ulcers Psychiatry: Judgement and insight appear normal. Mood & affect appropriate.     Data Reviewed: I have personally reviewed following labs and imaging studies  CBC:  Recent Labs Lab 06/12/17 1236  WBC 14.5*  NEUTROABS 12.3*  HGB 11.7*  HCT 37.1*  MCV 76.3*  PLT PLATELET CLUMPS NOTED ON SMEAR, COUNT APPEARS ADEQUATE   Basic Metabolic Panel:  Recent Labs Lab 06/12/17 1236  NA 130*  K 3.8  CL 91*  CO2 25  GLUCOSE 97  BUN 12  CREATININE 0.85  CALCIUM 9.4   GFR: Estimated Creatinine Clearance: 52 mL/min (by C-G formula based on SCr of 0.85 mg/dL). Liver Function Tests:  Recent Labs Lab  06/12/17 1236  AST 19  ALT 20  ALKPHOS 143*  BILITOT 0.6  PROT 7.6  ALBUMIN 2.9*   No results for input(s): LIPASE, AMYLASE in the last 168 hours. No results for input(s): AMMONIA in the last 168 hours. Coagulation Profile:  Recent Labs Lab 06/12/17 1236  INR 1.06   Cardiac Enzymes: No results for input(s): CKTOTAL, CKMB, CKMBINDEX, TROPONINI in the last 168 hours. BNP (last 3 results) No results for input(s): PROBNP in the last 8760 hours. HbA1C: No results for input(s): HGBA1C in the last 72 hours. CBG: No results for input(s): GLUCAP in the last 168 hours. Lipid Profile: No results for input(s): CHOL, HDL, LDLCALC, TRIG, CHOLHDL, LDLDIRECT in the last 72 hours. Thyroid Function Tests: No results for input(s): TSH, T4TOTAL, FREET4, T3FREE, THYROIDAB in the last 72 hours. Anemia Panel: No results for input(s): VITAMINB12, FOLATE, FERRITIN, TIBC, IRON, RETICCTPCT in the last 72 hours. Urine analysis: No results found for: COLORURINE, APPEARANCEUR, LABSPEC, PHURINE, GLUCOSEU, HGBUR, BILIRUBINUR, KETONESUR, PROTEINUR, UROBILINOGEN, NITRITE, LEUKOCYTESUR Sepsis Labs: @LABRCNTIP (procalcitonin:4,lacticidven:4)  ) Recent Results (from the past 240 hour(s))  Culture, blood (Routine x 2)     Status: None (Preliminary result)   Collection Time: 06/12/17 12:38 PM  Result Value Ref Range Status   Specimen Description RIGHT ANTECUBITAL  Final   Special Requests   Final    BOTTLES DRAWN AEROBIC AND ANAEROBIC Blood Culture adequate volume   Culture NO GROWTH < 24 HOURS  Final   Report Status PENDING  Incomplete  Culture, blood (Routine x 2)     Status: None (Preliminary result)   Collection Time: 06/12/17 12:40 PM  Result Value Ref Range Status   Specimen Description BLOOD RIGHT ARM  Final   Special Requests   Final    BOTTLES DRAWN AEROBIC AND ANAEROBIC Blood Culture results may not be optimal due to an inadequate volume of blood received in culture bottles   Culture NO GROWTH  < 24 HOURS  Final   Report Status PENDING  Incomplete         Radiology Studies: Dg Chest 2 View  Result Date: 06/12/2017 CLINICAL DATA:  COUGH X SEVERAL MONTHS, SOB AND FEVER X COUPLE WEEKS, HTN, SMOKER 1/2 PK/DAY EXAM: CHEST  2 VIEW COMPARISON:  09/25/2016 FINDINGS: Since prior exam, bilateral patchy airspace and coarse interstitial type opacities have developed in both upper lobes and in the left lower lung. This involves the left upper lobe to the greatest degree. Lungs are hyperexpanded.  No pleural effusion or pneumothorax. Cardiac silhouette is normal in size. Left hilum is partly obscured by the contiguous upper lobe opacity. No convincing mediastinal or right hilar masses or adenopathy. Skeletal structures are demineralized but grossly intact. IMPRESSION: 1. New bilateral upper lobe and left lower lobe  airspace and interstitial opacities consistent with multifocal pneumonia. Opportunistic infections including tuberculosis should be considered in the differential diagnosis. Electronically Signed   By: Amie Portlandavid  Ormond M.D.   On: 06/12/2017 12:24        Scheduled Meds: . enoxaparin (LOVENOX) injection  30 mg Subcutaneous Q24H  . feeding supplement (ENSURE ENLIVE)  237 mL Oral TID BM  . multivitamin with minerals  1 tablet Oral Daily   Continuous Infusions: . sodium chloride 75 mL/hr at 06/12/17 1842  . azithromycin 500 mg (06/13/17 1449)  . cefTRIAXone (ROCEPHIN)  IV Stopped (06/13/17 1310)     LOS: 1 day    Time spent: 35 minutes. Greater than 50% of this time was spent in direct contact with the patient coordinating care.     Chaya JanHERNANDEZ ACOSTA,ESTELA, MD [facility] Pager [phone]  If 7PM-7AM, please contact night-coverage [email or web address] Password [email or web address] 06/13/2017, 6:06 PM

## 2017-06-14 ENCOUNTER — Other Ambulatory Visit: Payer: Self-pay

## 2017-06-14 MED ORDER — SODIUM CHLORIDE 0.9 % IN NEBU
3.0000 mL | INHALATION_SOLUTION | Freq: Once | RESPIRATORY_TRACT | Status: AC
  Administered 2017-06-14: 3 mL via RESPIRATORY_TRACT

## 2017-06-14 NOTE — Progress Notes (Signed)
PROGRESS NOTE    Scott Ferguson  FAO:[id number] DOB: 1951-08-05 DOA: 06/12/2017 PCP: Aliene Beams, MD     Brief Narrative:  66 year old man admitted from home on 11/2 with cough, weight loss, fever and night sweats.  Found to have multifocal pneumonia, predominantly in bilateral upper bases which gives concern for tuberculosis.  Admission requested.   Assessment & Plan:   Principal Problem:   Lobar pneumonia (HCC) Active Problems:   Protein-calorie malnutrition, severe (HCC)   Multilobar pneumonia -Bilateral upper airspace opacities and constitutional symptoms are concerning for tuberculosis. -First AFB smear has been collected with results pending.  Will need at least 3 AFB smears negative before we can release him from airborne isolation. -However, common things being common this most likely represents community-acquired pneumonia and will continue Rocephin and azithromycin, culture data is currently pending. Strep pneumo urine antigen negative. -Has defervesced. -Influenza PCR is negative  Severe protein caloric malnutrition -Start Ensure, appreciate dietitian recommendations.   DVT prophylaxis: Lovenox Code Status: Full code  Family Communication: Patient only Disposition Plan: Pending medical stability and release from airborne isolation  Consultants:   None  Procedures:   None  Antimicrobials:  Anti-infectives (From admission, onward)   Start     Dose/Rate Route Frequency Ordered Stop   06/13/17 1500  azithromycin (ZITHROMAX) 500 mg in dextrose 5 % 250 mL IVPB     500 mg 250 mL/hr over 60 Minutes Intravenous Every 24 hours 06/12/17 1823 06/20/17 1459   06/13/17 1400  cefTRIAXone (ROCEPHIN) 1 g in dextrose 5 % 50 mL IVPB     1 g 100 mL/hr over 30 Minutes Intravenous Every 24 hours 06/12/17 1823 06/20/17 1359   06/12/17 1300  cefTRIAXone (ROCEPHIN) 1 g in dextrose 5 % 50 mL IVPB     1 g 100 mL/hr over 30 Minutes Intravenous  Once 06/12/17 1254 06/12/17  1518   06/12/17 1300  azithromycin (ZITHROMAX) 500 mg in dextrose 5 % 250 mL IVPB     500 mg 250 mL/hr over 60 Minutes Intravenous  Once 06/12/17 1254 06/12/17 1511       Subjective: Feels better, less weak, less SOB, no CP.  Objective: Vitals:   06/13/17 2154 06/14/17 0700 06/14/17 1426 06/14/17 1504  BP: 110/68 100/60 (!) 101/58   Pulse: (!) 103 100 86   Resp: 20 18 18    Temp: 98 F (36.7 C) 98.9 F (37.2 C) (!) 97.5 F (36.4 C)   TempSrc: Oral Oral Oral   SpO2: 97% 96% 98% 99%  Weight:      Height:        Intake/Output Summary (Last 24 hours) at 06/14/2017 1538 Last data filed at 06/14/2017 1200 Gross per 24 hour  Intake 3035 ml  Output 1650 ml  Net 1385 ml   Filed Weights   06/12/17 1153 06/12/17 1553 06/12/17 1816  Weight: 44.9 kg (99 lb) 58.9 kg (129 lb 13.6 oz) 43.6 kg (96 lb 3.2 oz)    Examination:  General exam: Alert, awake, oriented x 3, cachectic, appears malnourished Respiratory system: mild bilateral ronchi Cardiovascular system:RRR. No murmurs, rubs, gallops. Gastrointestinal system: Abdomen is nondistended, soft and nontender. No organomegaly or masses felt. Normal bowel sounds heard. Central nervous system: Alert and oriented. No focal neurological deficits. Extremities: No C/C/E, +pedal pulses Skin: No rashes, lesions or ulcers Psychiatry: Judgement and insight appear normal. Mood & affect appropriate.      Data Reviewed: I have personally reviewed following labs and imaging studies  CBC: Recent  Labs  Lab 06/12/17 1236  WBC 14.5*  NEUTROABS 12.3*  HGB 11.7*  HCT 37.1*  MCV 76.3*  PLT PLATELET CLUMPS NOTED ON SMEAR, COUNT APPEARS ADEQUATE   Basic Metabolic Panel: Recent Labs  Lab 06/12/17 1236  NA 130*  K 3.8  CL 91*  CO2 25  GLUCOSE 97  BUN 12  CREATININE 0.85  CALCIUM 9.4   GFR: Estimated Creatinine Clearance: 52 mL/min (by C-G formula based on SCr of 0.85 mg/dL). Liver Function Tests: Recent Labs  Lab 06/12/17 1236    AST 19  ALT 20  ALKPHOS 143*  BILITOT 0.6  PROT 7.6  ALBUMIN 2.9*   No results for input(s): LIPASE, AMYLASE in the last 168 hours. No results for input(s): AMMONIA in the last 168 hours. Coagulation Profile: Recent Labs  Lab 06/12/17 1236  INR 1.06   Cardiac Enzymes: No results for input(s): CKTOTAL, CKMB, CKMBINDEX, TROPONINI in the last 168 hours. BNP (last 3 results) No results for input(s): PROBNP in the last 8760 hours. HbA1C: No results for input(s): HGBA1C in the last 72 hours. CBG: No results for input(s): GLUCAP in the last 168 hours. Lipid Profile: No results for input(s): CHOL, HDL, LDLCALC, TRIG, CHOLHDL, LDLDIRECT in the last 72 hours. Thyroid Function Tests: No results for input(s): TSH, T4TOTAL, FREET4, T3FREE, THYROIDAB in the last 72 hours. Anemia Panel: No results for input(s): VITAMINB12, FOLATE, FERRITIN, TIBC, IRON, RETICCTPCT in the last 72 hours. Urine analysis: No results found for: COLORURINE, APPEARANCEUR, LABSPEC, PHURINE, GLUCOSEU, HGBUR, BILIRUBINUR, KETONESUR, PROTEINUR, UROBILINOGEN, NITRITE, LEUKOCYTESUR Sepsis Labs: @LABRCNTIP (procalcitonin:4,lacticidven:4)  ) Recent Results (from the past 240 hour(s))  Culture, blood (Routine x 2)     Status: None (Preliminary result)   Collection Time: 06/12/17 12:38 PM  Result Value Ref Range Status   Specimen Description RIGHT ANTECUBITAL  Final   Special Requests   Final    BOTTLES DRAWN AEROBIC AND ANAEROBIC Blood Culture adequate volume   Culture NO GROWTH 2 DAYS  Final   Report Status PENDING  Incomplete  Culture, blood (Routine x 2)     Status: None (Preliminary result)   Collection Time: 06/12/17 12:40 PM  Result Value Ref Range Status   Specimen Description BLOOD RIGHT ARM  Final   Special Requests   Final    BOTTLES DRAWN AEROBIC AND ANAEROBIC Blood Culture results may not be optimal due to an inadequate volume of blood received in culture bottles   Culture NO GROWTH 2 DAYS  Final    Report Status PENDING  Incomplete         Radiology Studies: No results found.      Scheduled Meds: . enoxaparin (LOVENOX) injection  30 mg Subcutaneous Q24H  . feeding supplement (ENSURE ENLIVE)  237 mL Oral TID BM  . multivitamin with minerals  1 tablet Oral Daily   Continuous Infusions: . sodium chloride 75 mL/hr at 06/14/17 0357  . azithromycin 500 mg (06/14/17 1503)  . cefTRIAXone (ROCEPHIN)  IV Stopped (06/14/17 1502)     LOS: 2 days    Time spent: 25 minutes. Greater than 50% of this time was spent in direct contact with the patient coordinating care.     Chaya JanHERNANDEZ ACOSTA,ESTELA, MD [facility] Pager [phone]  If 7PM-7AM, please contact night-coverage [email or web address] Password [email or web address] 06/14/2017, 3:38 PM

## 2017-06-15 LAB — BASIC METABOLIC PANEL
Anion gap: 7 (ref 5–15)
BUN: 9 mg/dL (ref 6–20)
CHLORIDE: 99 mmol/L — AB (ref 101–111)
CO2: 24 mmol/L (ref 22–32)
CREATININE: 0.6 mg/dL — AB (ref 0.61–1.24)
Calcium: 8 mg/dL — ABNORMAL LOW (ref 8.9–10.3)
GFR calc Af Amer: >60 mL/min (ref >60)
GFR calc non Af Amer: >60 mL/min (ref >60)
GLUCOSE: 103 mg/dL — AB (ref 65–99)
Potassium: 3.8 mmol/L (ref 3.5–5.1)
Sodium: 130 mmol/L — ABNORMAL LOW (ref 135–145)

## 2017-06-15 LAB — CBC
HEMATOCRIT: 31.2 % — AB (ref 39.0–52.0)
Hemoglobin: 9.9 g/dL — ABNORMAL LOW (ref 13.0–17.0)
MCH: 24.8 pg — AB (ref 26.0–34.0)
MCHC: 31.7 g/dL (ref 30.0–36.0)
MCV: 78.2 fL (ref 78.0–100.0)
PLATELETS: 699 10*3/uL — AB (ref 150–400)
RBC: 3.99 MIL/uL — ABNORMAL LOW (ref 4.22–5.81)
RDW: 15.7 % — AB (ref 11.5–15.5)
WBC: 13.2 10*3/uL — ABNORMAL HIGH (ref 4.0–10.5)

## 2017-06-15 NOTE — Progress Notes (Signed)
PROGRESS NOTE    Scott Ferguson  KGM:[id number] DOB: 02/24/1951 DOA: 06/12/2017 PCP: Aliene BeamsHagler, [name], MD     Brief Narrative:  66 year old man admitted from home on 11/2 with cough, weight loss, fever and night sweats.  Found to have multifocal pneumonia, predominantly in bilateral upper bases which gives concern for tuberculosis.  Admission requested.   Assessment & Plan:   Principal Problem:   Lobar pneumonia (HCC) Active Problems:   Protein-calorie malnutrition, severe (HCC)   Multilobar pneumonia -Bilateral upper airspace opacities and constitutional symptoms are concerning for tuberculosis. -Called [facility] to discuss AFB smears.  They state that results will take at least 48 hours from today.  Remains in airborne isolation. -Third AFB sample to be collected today. -However, common things being common this most likely represents community-acquired pneumonia and will continue Rocephin and azithromycin, culture data is currently pending. Strep pneumo urine antigen negative. -Had a temperature of 100.2 overnight.  Follow fever curve. -Influenza PCR is negative  Severe protein caloric malnutrition -Start Ensure, appreciate dietitian recommendations.   DVT prophylaxis: Lovenox Code Status: Full code  Family Communication: Patient only Disposition Plan: Pending medical stability and release from airborne isolation  Consultants:   None  Procedures:   None  Antimicrobials:  Anti-infectives (From admission, onward)   Start     Dose/Rate Route Frequency Ordered Stop   06/13/17 1500  azithromycin (ZITHROMAX) 500 mg in dextrose 5 % 250 mL IVPB     500 mg 250 mL/hr over 60 Minutes Intravenous Every 24 hours 06/12/17 1823 06/20/17 1459   06/13/17 1400  cefTRIAXone (ROCEPHIN) 1 g in dextrose 5 % 50 mL IVPB     1 g 100 mL/hr over 30 Minutes Intravenous Every 24 hours 06/12/17 1823 06/20/17 1359   06/12/17 1300  cefTRIAXone (ROCEPHIN) 1 g in dextrose 5 % 50 mL IVPB     1  g 100 mL/hr over 30 Minutes Intravenous  Once 06/12/17 1254 06/12/17 1518   06/12/17 1300  azithromycin (ZITHROMAX) 500 mg in dextrose 5 % 250 mL IVPB     500 mg 250 mL/hr over 60 Minutes Intravenous  Once 06/12/17 1254 06/12/17 1511       Subjective: States he feels better, less short of breath, still weak.  Objective: Vitals:   06/14/17 1658 06/14/17 2300 06/15/17 1237 06/15/17 1444  BP:  (!) 90/53 103/75   Pulse:  97 (!) 104   Resp:  18 18   Temp: 97.6 F (36.4 C) 98 F (36.7 C) 100.2 F (37.9 C) 99.4 F (37.4 C)  TempSrc: Oral Oral Oral Oral  SpO2:  98% 98%   Weight:      Height:        Intake/Output Summary (Last 24 hours) at 06/15/2017 1725 Last data filed at 06/15/2017 1200 Gross per 24 hour  Intake 2628.75 ml  Output 1150 ml  Net 1478.75 ml   Filed Weights   06/12/17 1153 06/12/17 1553 06/12/17 1816  Weight: 44.9 kg (99 lb) 58.9 kg (129 lb 13.6 oz) 43.6 kg (96 lb 3.2 oz)    Examination:  General exam: Alert, awake, oriented x 3 Respiratory system: Mild bilateral ronchi Cardiovascular system:RRR. No murmurs, rubs, gallops. Gastrointestinal system: Abdomen is nondistended, soft and nontender. No organomegaly or masses felt. Normal bowel sounds heard. Central nervous system: Alert and oriented. No focal neurological deficits. Extremities: No C/C/E, +pedal pulses Skin: No rashes, lesions or ulcers Psychiatry: Judgement and insight appear normal. Mood & affect appropriate.  Data Reviewed: I have personally reviewed following labs and imaging studies  CBC: Recent Labs  Lab 06/12/17 1236 06/15/17 0601  WBC 14.5* 13.2*  NEUTROABS 12.3*  --   HGB 11.7* 9.9*  HCT 37.1* 31.2*  MCV 76.3* 78.2  PLT PLATELET CLUMPS NOTED ON SMEAR, COUNT APPEARS ADEQUATE 699*   Basic Metabolic Panel: Recent Labs  Lab 06/12/17 1236 06/15/17 0601  NA 130* 130*  K 3.8 3.8  CL 91* 99*  CO2 25 24  GLUCOSE 97 103*  BUN 12 9  CREATININE 0.85 0.60*  CALCIUM 9.4  8.0*   GFR: Estimated Creatinine Clearance: 55.3 mL/min (A) (by C-G formula based on SCr of 0.6 mg/dL (L)). Liver Function Tests: Recent Labs  Lab 06/12/17 1236  AST 19  ALT 20  ALKPHOS 143*  BILITOT 0.6  PROT 7.6  ALBUMIN 2.9*   No results for input(s): LIPASE, AMYLASE in the last 168 hours. No results for input(s): AMMONIA in the last 168 hours. Coagulation Profile: Recent Labs  Lab 06/12/17 1236  INR 1.06   Cardiac Enzymes: No results for input(s): CKTOTAL, CKMB, CKMBINDEX, TROPONINI in the last 168 hours. BNP (last 3 results) No results for input(s): PROBNP in the last 8760 hours. HbA1C: No results for input(s): HGBA1C in the last 72 hours. CBG: No results for input(s): GLUCAP in the last 168 hours. Lipid Profile: No results for input(s): CHOL, HDL, LDLCALC, TRIG, CHOLHDL, LDLDIRECT in the last 72 hours. Thyroid Function Tests: No results for input(s): TSH, T4TOTAL, FREET4, T3FREE, THYROIDAB in the last 72 hours. Anemia Panel: No results for input(s): VITAMINB12, FOLATE, FERRITIN, TIBC, IRON, RETICCTPCT in the last 72 hours. Urine analysis: No results found for: COLORURINE, APPEARANCEUR, LABSPEC, PHURINE, GLUCOSEU, HGBUR, BILIRUBINUR, KETONESUR, PROTEINUR, UROBILINOGEN, NITRITE, LEUKOCYTESUR Sepsis Labs: @LABRCNTIP (procalcitonin:4,lacticidven:4)  ) Recent Results (from the past 240 hour(s))  Culture, blood (Routine x 2)     Status: None (Preliminary result)   Collection Time: 06/12/17 12:38 PM  Result Value Ref Range Status   Specimen Description RIGHT ANTECUBITAL  Final   Special Requests   Final    BOTTLES DRAWN AEROBIC AND ANAEROBIC Blood Culture adequate volume   Culture NO GROWTH 3 DAYS  Final   Report Status PENDING  Incomplete  Culture, blood (Routine x 2)     Status: None (Preliminary result)   Collection Time: 06/12/17 12:40 PM  Result Value Ref Range Status   Specimen Description BLOOD RIGHT ARM  Final   Special Requests   Final    BOTTLES DRAWN  AEROBIC AND ANAEROBIC Blood Culture results may not be optimal due to an inadequate volume of blood received in culture bottles   Culture NO GROWTH 3 DAYS  Final   Report Status PENDING  Incomplete         Radiology Studies: No results found.      Scheduled Meds: . enoxaparin (LOVENOX) injection  30 mg Subcutaneous Q24H  . feeding supplement (ENSURE ENLIVE)  237 mL Oral TID BM  . multivitamin with minerals  1 tablet Oral Daily   Continuous Infusions: . sodium chloride 75 mL/hr at 06/14/17 2059  . azithromycin 500 mg (06/15/17 1440)  . cefTRIAXone (ROCEPHIN)  IV Stopped (06/15/17 1430)     LOS: 3 days    Time spent: 25 minutes. Greater than 50% of this time was spent in direct contact with the patient coordinating care.     Chaya Jan, MD [facility] Pager [phone]  If 7PM-7AM, please contact night-coverage [email or web address] Password [facility] 06/15/2017,  5:25 PM

## 2017-06-16 LAB — ACID FAST SMEAR (AFB, MYCOBACTERIA): Acid Fast Smear: POSITIVE — AB

## 2017-06-16 LAB — CBC
HEMATOCRIT: 30.9 % — AB (ref 39.0–52.0)
HEMOGLOBIN: 9.8 g/dL — AB (ref 13.0–17.0)
MCH: 24.6 pg — AB (ref 26.0–34.0)
MCHC: 31.7 g/dL (ref 30.0–36.0)
MCV: 77.4 fL — AB (ref 78.0–100.0)
Platelets: 602 10*3/uL — ABNORMAL HIGH (ref 150–400)
RBC: 3.99 MIL/uL — AB (ref 4.22–5.81)
RDW: 15.7 % — AB (ref 11.5–15.5)
WBC: 17.7 10*3/uL — ABNORMAL HIGH (ref 4.0–10.5)

## 2017-06-16 LAB — ACID FAST SMEAR (AFB)

## 2017-06-16 MED ORDER — SODIUM CHLORIDE 3 % IN NEBU
4.0000 mL | INHALATION_SOLUTION | Freq: Once | RESPIRATORY_TRACT | Status: AC
  Administered 2017-06-16: 4 mL via RESPIRATORY_TRACT
  Filled 2017-06-16: qty 4

## 2017-06-16 NOTE — Progress Notes (Signed)
CRITICAL VALUE ALERT  Critical Value:  AFB  +4  Date & Time Notied: 06/16/2017  0934  Provider Notified: Craige CottaKirby  Orders Received/Actions taken: Reported findings at 2149. Spoke with Craige CottaKirby at 2157 about starting medications tonight versus in AM.

## 2017-06-16 NOTE — Progress Notes (Signed)
Respiratory Care Late Entry Note: The patient was ordered a sputum induction. While give the patient his hypertonic saline neb the patient started coughing and stated " I feel like I have to throw up." I had to stop the patient's treatment half through it. I instructed the patient that if he coughs up anything to please notify his RN right away so specimen can be sent to the lab right away. I also encouraged him to use his flutter valve. He stated " I don't think anything is left down there."

## 2017-06-16 NOTE — Progress Notes (Signed)
PROGRESS NOTE    Scott Ferguson  ZOX:[id number] DOB: 06/15/1951 DOA: 06/12/2017 PCP: Scott BeamsHagler, [name], MD     Brief Narrative:  66 year old man admitted from home on 11/2 with cough, weight loss, fever and night sweats.  Found to have multifocal pneumonia, predominantly in bilateral upper bases which brings concern for tuberculosis.  Admission requested.   Assessment & Plan:   Principal Problem:   Lobar pneumonia (HCC) Active Problems:   Protein-calorie malnutrition, severe (HCC)   Multilobar pneumonia -Bilateral upper airspace opacities and constitutional symptoms are concerning for tuberculosis. -Called [facility] on 11/5 to discuss AFB smears.  They stated that results will take at least 48 hours from then (so we should have some results back on 11/7).  Remains in airborne isolation. -Third AFB sample to be collected today. -However, common things being common this most likely represents community-acquired pneumonia and will continue Rocephin and azithromycin, culture data is currently pending. Strep pneumo urine antigen negative. He feels symptomatically improved. -Had a temperature of 100.7 overnight.  Follow fever curve. -Influenza PCR is negative  Severe protein caloric malnutrition -Start Ensure, appreciate dietitian recommendations.   DVT prophylaxis: Lovenox Code Status: Full code  Family Communication: Patient only Disposition Plan: Pending medical stability and release from airborne isolation  Consultants:   None  Procedures:   None  Antimicrobials:  Anti-infectives (From admission, onward)   Start     Dose/Rate Route Frequency Ordered Stop   06/13/17 1500  azithromycin (ZITHROMAX) 500 mg in dextrose 5 % 250 mL IVPB     500 mg 250 mL/hr over 60 Minutes Intravenous Every 24 hours 06/12/17 1823 06/20/17 1459   06/13/17 1400  cefTRIAXone (ROCEPHIN) 1 g in dextrose 5 % 50 mL IVPB     1 g 100 mL/hr over 30 Minutes Intravenous Every 24 hours 06/12/17 1823  06/20/17 1359   06/12/17 1300  cefTRIAXone (ROCEPHIN) 1 g in dextrose 5 % 50 mL IVPB     1 g 100 mL/hr over 30 Minutes Intravenous  Once 06/12/17 1254 06/12/17 1518   06/12/17 1300  azithromycin (ZITHROMAX) 500 mg in dextrose 5 % 250 mL IVPB     500 mg 250 mL/hr over 60 Minutes Intravenous  Once 06/12/17 1254 06/12/17 1511       Subjective: Says he feels better, less SOB, not much sputum production  Objective: Vitals:   06/15/17 2330 06/16/17 0724 06/16/17 0929 06/16/17 1220  BP: 106/60 95/63  108/73  Pulse: (!) 115 (!) 107  (!) 116  Resp: 20 20  18   Temp: (!) 100.7 F (38.2 C) 98.9 F (37.2 C)  98 F (36.7 C)  TempSrc: Oral Oral  Oral  SpO2: 97% 94% 94% 97%  Weight:      Height:        Intake/Output Summary (Last 24 hours) at 06/16/2017 1644 Last data filed at 06/16/2017 1443 Gross per 24 hour  Intake 3462.75 ml  Output 2300 ml  Net 1162.75 ml   Filed Weights   06/12/17 1153 06/12/17 1553 06/12/17 1816  Weight: 44.9 kg (99 lb) 58.9 kg (129 lb 13.6 oz) 43.6 kg (96 lb 3.2 oz)    Examination:  General exam: Alert, awake, oriented x 3 Respiratory system: mild bilateral ronchi Cardiovascular system:RRR. No murmurs, rubs, gallops. Gastrointestinal system: Abdomen is nondistended, soft and nontender. No organomegaly or masses felt. Normal bowel sounds heard. Central nervous system: Alert and oriented. No focal neurological deficits. Extremities: No C/C/E, +pedal pulses Skin: No rashes, lesions or ulcers Psychiatry:  Judgement and insight appear normal. Mood & affect appropriate.         Data Reviewed: I have personally reviewed following labs and imaging studies  CBC: Recent Labs  Lab 06/12/17 1236 06/15/17 0601 06/16/17 0507  WBC 14.5* 13.2* 17.7*  NEUTROABS 12.3*  --   --   HGB 11.7* 9.9* 9.8*  HCT 37.1* 31.2* 30.9*  MCV 76.3* 78.2 77.4*  PLT PLATELET CLUMPS NOTED ON SMEAR, COUNT APPEARS ADEQUATE 699* 602*   Basic Metabolic Panel: Recent Labs  Lab  06/12/17 1236 06/15/17 0601  NA 130* 130*  K 3.8 3.8  CL 91* 99*  CO2 25 24  GLUCOSE 97 103*  BUN 12 9  CREATININE 0.85 0.60*  CALCIUM 9.4 8.0*   GFR: Estimated Creatinine Clearance: 55.3 mL/min (A) (by C-G formula based on SCr of 0.6 mg/dL (L)). Liver Function Tests: Recent Labs  Lab 06/12/17 1236  AST 19  ALT 20  ALKPHOS 143*  BILITOT 0.6  PROT 7.6  ALBUMIN 2.9*   No results for input(s): LIPASE, AMYLASE in the last 168 hours. No results for input(s): AMMONIA in the last 168 hours. Coagulation Profile: Recent Labs  Lab 06/12/17 1236  INR 1.06   Cardiac Enzymes: No results for input(s): CKTOTAL, CKMB, CKMBINDEX, TROPONINI in the last 168 hours. BNP (last 3 results) No results for input(s): PROBNP in the last 8760 hours. HbA1C: No results for input(s): HGBA1C in the last 72 hours. CBG: No results for input(s): GLUCAP in the last 168 hours. Lipid Profile: No results for input(s): CHOL, HDL, LDLCALC, TRIG, CHOLHDL, LDLDIRECT in the last 72 hours. Thyroid Function Tests: No results for input(s): TSH, T4TOTAL, FREET4, T3FREE, THYROIDAB in the last 72 hours. Anemia Panel: No results for input(s): VITAMINB12, FOLATE, FERRITIN, TIBC, IRON, RETICCTPCT in the last 72 hours. Urine analysis: No results found for: COLORURINE, APPEARANCEUR, LABSPEC, PHURINE, GLUCOSEU, HGBUR, BILIRUBINUR, KETONESUR, PROTEINUR, UROBILINOGEN, NITRITE, LEUKOCYTESUR Sepsis Labs: @LABRCNTIP (procalcitonin:4,lacticidven:4)  ) Recent Results (from the past 240 hour(s))  Culture, blood (Routine x 2)     Status: None (Preliminary result)   Collection Time: 06/12/17 12:38 PM  Result Value Ref Range Status   Specimen Description RIGHT ANTECUBITAL  Final   Special Requests   Final    BOTTLES DRAWN AEROBIC AND ANAEROBIC Blood Culture adequate volume   Culture NO GROWTH 4 DAYS  Final   Report Status PENDING  Incomplete  Culture, blood (Routine x 2)     Status: None (Preliminary result)   Collection  Time: 06/12/17 12:40 PM  Result Value Ref Range Status   Specimen Description BLOOD RIGHT ARM  Final   Special Requests   Final    BOTTLES DRAWN AEROBIC AND ANAEROBIC Blood Culture results may not be optimal due to an inadequate volume of blood received in culture bottles   Culture NO GROWTH 4 DAYS  Final   Report Status PENDING  Incomplete         Radiology Studies: No results found.      Scheduled Meds: . enoxaparin (LOVENOX) injection  30 mg Subcutaneous Q24H  . feeding supplement (ENSURE ENLIVE)  237 mL Oral TID BM  . multivitamin with minerals  1 tablet Oral Daily   Continuous Infusions: . sodium chloride 75 mL/hr at 06/16/17 1629  . azithromycin 500 mg (06/16/17 1628)  . cefTRIAXone (ROCEPHIN)  IV Stopped (06/16/17 1513)     LOS: 4 days    Time spent: 25 minutes. Greater than 50% of this time was spent in direct  contact with the patient coordinating care.     Chaya Jan, MD [facility] Pager [phone]  If 7PM-7AM, please contact night-coverage [email or web address] Password [email or web address] 06/16/2017, 4:44 PM

## 2017-06-17 DIAGNOSIS — A319 Mycobacterial infection, unspecified: Secondary | ICD-10-CM

## 2017-06-17 DIAGNOSIS — J181 Lobar pneumonia, unspecified organism: Principal | ICD-10-CM

## 2017-06-17 DIAGNOSIS — E43 Unspecified severe protein-calorie malnutrition: Secondary | ICD-10-CM

## 2017-06-17 DIAGNOSIS — J158 Pneumonia due to other specified bacteria: Secondary | ICD-10-CM

## 2017-06-17 DIAGNOSIS — E871 Hypo-osmolality and hyponatremia: Secondary | ICD-10-CM

## 2017-06-17 LAB — RAPID URINE DRUG SCREEN, HOSP PERFORMED
Amphetamines: NOT DETECTED
BARBITURATES: NOT DETECTED
BENZODIAZEPINES: NOT DETECTED
COCAINE: NOT DETECTED
OPIATES: NOT DETECTED
Tetrahydrocannabinol: NOT DETECTED

## 2017-06-17 LAB — CULTURE, BLOOD (ROUTINE X 2)
Culture: NO GROWTH
Culture: NO GROWTH
Special Requests: ADEQUATE

## 2017-06-17 LAB — BASIC METABOLIC PANEL
ANION GAP: 10 (ref 5–15)
BUN: 11 mg/dL (ref 6–20)
CO2: 23 mmol/L (ref 22–32)
Calcium: 8.3 mg/dL — ABNORMAL LOW (ref 8.9–10.3)
Chloride: 98 mmol/L — ABNORMAL LOW (ref 101–111)
Creatinine, Ser: 0.6 mg/dL — ABNORMAL LOW (ref 0.61–1.24)
GLUCOSE: 103 mg/dL — AB (ref 65–99)
POTASSIUM: 4.2 mmol/L (ref 3.5–5.1)
Sodium: 131 mmol/L — ABNORMAL LOW (ref 135–145)

## 2017-06-17 LAB — CBC
HEMATOCRIT: 30 % — AB (ref 39.0–52.0)
HEMOGLOBIN: 9.8 g/dL — AB (ref 13.0–17.0)
MCH: 25.4 pg — AB (ref 26.0–34.0)
MCHC: 32.7 g/dL (ref 30.0–36.0)
MCV: 77.7 fL — ABNORMAL LOW (ref 78.0–100.0)
Platelets: 606 10*3/uL — ABNORMAL HIGH (ref 150–400)
RBC: 3.86 MIL/uL — AB (ref 4.22–5.81)
RDW: 15.9 % — ABNORMAL HIGH (ref 11.5–15.5)
WBC: 15.2 10*3/uL — ABNORMAL HIGH (ref 4.0–10.5)

## 2017-06-17 LAB — OSMOLALITY: Osmolality: 274 mOsm/kg — ABNORMAL LOW (ref 275–295)

## 2017-06-17 LAB — OSMOLALITY, URINE: Osmolality, Ur: 310 mOsm/kg (ref 300–900)

## 2017-06-17 LAB — CREATININE, URINE, RANDOM: CREATININE, URINE: 32.69 mg/dL

## 2017-06-17 LAB — SODIUM, URINE, RANDOM: Sodium, Ur: 79 mmol/L

## 2017-06-17 MED ORDER — RIFAMPIN 300 MG PO CAPS
600.0000 mg | ORAL_CAPSULE | Freq: Every day | ORAL | Status: DC
  Administered 2017-06-17 – 2017-06-19 (×3): 600 mg via ORAL
  Filled 2017-06-17 (×6): qty 2

## 2017-06-17 MED ORDER — ENSURE ENLIVE PO LIQD: 237.0000 mL | Freq: Three times a day (TID) | ORAL | 0 refills | Status: DC

## 2017-06-17 MED ORDER — VITAMIN B-6 50 MG PO TABS
50.0000 mg | ORAL_TABLET | Freq: Every day | ORAL | Status: DC
  Administered 2017-06-17 – 2017-06-19 (×3): 50 mg via ORAL
  Filled 2017-06-17 (×3): qty 1

## 2017-06-17 MED ORDER — RIFAMPIN 300 MG PO CAPS: 300.0000 mg | ORAL_CAPSULE | Freq: Every day | ORAL | 0 refills | Status: DC

## 2017-06-17 MED ORDER — ISONIAZID 300 MG PO TABS
300.0000 mg | ORAL_TABLET | Freq: Every day | ORAL | Status: DC
  Administered 2017-06-17 – 2017-06-19 (×3): 300 mg via ORAL
  Filled 2017-06-17 (×7): qty 1

## 2017-06-17 MED ORDER — RIFAMPIN 300 MG PO CAPS
450.0000 mg | ORAL_CAPSULE | Freq: Every day | ORAL | Status: DC
  Filled 2017-06-17 (×3): qty 1

## 2017-06-17 MED ORDER — PYRAZINAMIDE 500 MG PO TABS
1000.0000 mg | ORAL_TABLET | Freq: Every day | ORAL | Status: DC
  Administered 2017-06-17 – 2017-06-19 (×3): 1000 mg via ORAL
  Filled 2017-06-17 (×6): qty 2

## 2017-06-17 MED ORDER — RIFAMPIN 150 MG PO CAPS: 150.0000 mg | ORAL_CAPSULE | Freq: Every day | ORAL | 0 refills | Status: AC

## 2017-06-17 MED ORDER — ETHAMBUTOL HCL 400 MG PO TABS
800.0000 mg | ORAL_TABLET | Freq: Every day | ORAL | Status: DC
  Administered 2017-06-17 – 2017-06-19 (×3): 800 mg via ORAL
  Filled 2017-06-17 (×6): qty 2

## 2017-06-17 MED ORDER — PYRIDOXINE HCL 50 MG PO TABS: 50.0000 mg | ORAL_TABLET | Freq: Every day | ORAL | 0 refills | Status: DC

## 2017-06-17 MED ORDER — PYRAZINAMIDE 500 MG PO TABS: 1000.0000 mg | ORAL_TABLET | Freq: Every day | ORAL | 0 refills | Status: DC

## 2017-06-17 MED ORDER — ISONIAZID 100 MG PO TABS
250.0000 mg | ORAL_TABLET | Freq: Every day | ORAL | Status: DC
  Filled 2017-06-17 (×3): qty 3

## 2017-06-17 MED ORDER — ISONIAZID 300 MG PO TABS
300.0000 mg | ORAL_TABLET | Freq: Every day | ORAL | Status: DC
  Filled 2017-06-17 (×2): qty 1

## 2017-06-17 MED ORDER — ETHAMBUTOL HCL 400 MG PO TABS: 800.0000 mg | ORAL_TABLET | Freq: Every day | ORAL | 0 refills | Status: DC

## 2017-06-17 MED ORDER — ISONIAZID 300 MG PO TABS: 300.0000 mg | ORAL_TABLET | Freq: Every day | ORAL | 0 refills | Status: DC

## 2017-06-17 NOTE — Progress Notes (Signed)
PROGRESS NOTE  Scott Ferguson JXB:[id number] DOB: 08/01/1951 DOA: 06/12/2017 PCP: Aliene BeamsHagler, [name], MD  Brief History:  66 year old male with a history of hypertension, hyperlipidemia, and cocaine abuse presented with 2-week history of worsening shortness of breath and cough with brown sputum.  The patient also has had anorexia with unintentional weight loss.  He states that he has lost 10 pounds in the past week.  He complains of fevers and chills with soaking night sweats.  The patient previously smoked cocaine, but states that he has not used in 5 months.  He quit smoking tobacco approximately 3 weeks prior to admission.  The patient denies any recent TB exposures.  He was previously incarcerated, but this was over 20 years ago.  Patient went to see his primary care provider on the day of admission, and because of fever and tachycardia, the patient was referred to the emergency department.  After admission, the patient was placed in respiratory isolation with sputum for AFB collected.  The patient was empirically started on azithromycin and ceftriaxone.  Once AFB sputum stains were positive, the patient was started on 4 drug therapy for presumptive MTB  Assessment/Plan: Lobar pneumonia--Mycobacterial -high suspicion for MTB -sputum positive for 4+ AFB -start 4 drug therapy -[facility]--left voicemail to call me back -add NAA to MTB sputum -HIV negative -d/c ceftriaxone and azithromycin  Hyponatremia -due to poor solute intake -check serum osm -check urine osm -urine Na -urine creatinine  Severe protein calorie malnutrition -continue supplements  Hx cocaine use -UDS  Disposition Plan:   Home in 2-3 days once cleared by Health Dept Family Communication:  No Family at bedside--Total time spent 35 minutes.  Greater than 50% spent face to face counseling and coordinating care.   Consultants:  none  Code Status:   FULL  DVT Prophylaxis:  [place] Lovenox   Procedures: As Listed in Progress Note Above  Antibiotics: Ceftriaxone 11/2>>>11/6 Azithromycin 11/2>>>11/6 RIPE 11/7>>>    Subjective: Patient denies any fevers, chills, chest pain, shortness breath, nausea, vomiting, diarrhea, abdominal pain.  He states that his cough is a little bit better.  He is producing some brown sputum but it is less.  No dysuria, hematuria, headache, neck pain.  Objective: Vitals:   06/16/17 1948 06/16/17 2030 06/17/17 0620 06/17/17 0856  BP:  99/64 101/69   Pulse:  (!) 101 98   Resp:  20 18   Temp:  97.7 F (36.5 C) 98 F (36.7 C)   TempSrc:  Oral Oral   SpO2: 95% 98% 99% 98%  Weight:      Height:        Intake/Output Summary (Last 24 hours) at 06/17/2017 1050 Last data filed at 06/17/2017 0929 Gross per 24 hour  Intake 2847.75 ml  Output 1400 ml  Net 1447.75 ml   Weight change:  Exam:   General:  Pt is alert, follows commands appropriately, not in acute distress  HEENT: No icterus, No thrush, No neck mass, [place]/AT  Cardiovascular: RRR, S1/S2, no rubs, no gallops  Respiratory: Bilateral rales, left greater than right.  No wheezing.  Good air movement.  Abdomen: Soft/+BS, non tender, non distended, no guarding  Extremities: No edema, No lymphangitis, No petechiae, No rashes, no synovitis   Data Reviewed: I have personally reviewed following labs and imaging studies Basic Metabolic Panel: Recent Labs  Lab 06/12/17 1236 06/15/17 0601 06/17/17 0410  NA 130* 130* 131*  K 3.8 3.8 4.2  CL 91* 99* 98*  CO2 25 24 23   GLUCOSE 97 103* 103*  BUN 12 9 11   CREATININE 0.85 0.60* 0.60*  CALCIUM 9.4 8.0* 8.3*   Liver Function Tests: Recent Labs  Lab 06/12/17 1236  AST 19  ALT 20  ALKPHOS 143*  BILITOT 0.6  PROT 7.6  ALBUMIN 2.9*   No results for input(s): LIPASE, AMYLASE in the last 168 hours. No results for input(s): AMMONIA in the last 168 hours. Coagulation Profile: Recent Labs  Lab  06/12/17 1236  INR 1.06   CBC: Recent Labs  Lab 06/12/17 1236 06/15/17 0601 06/16/17 0507 06/17/17 0410  WBC 14.5* 13.2* 17.7* 15.2*  NEUTROABS 12.3*  --   --   --   HGB 11.7* 9.9* 9.8* 9.8*  HCT 37.1* 31.2* 30.9* 30.0*  MCV 76.3* 78.2 77.4* 77.7*  PLT PLATELET CLUMPS NOTED ON SMEAR, COUNT APPEARS ADEQUATE 699* 602* 606*   Cardiac Enzymes: No results for input(s): CKTOTAL, CKMB, CKMBINDEX, TROPONINI in the last 168 hours. BNP: Invalid input(s): POCBNP CBG: No results for input(s): GLUCAP in the last 168 hours. HbA1C: No results for input(s): HGBA1C in the last 72 hours. Urine analysis: No results found for: COLORURINE, APPEARANCEUR, LABSPEC, PHURINE, GLUCOSEU, HGBUR, BILIRUBINUR, KETONESUR, PROTEINUR, UROBILINOGEN, NITRITE, LEUKOCYTESUR Sepsis Labs: @LABRCNTIP (procalcitonin:4,lacticidven:4) ) Recent Results (from the past 240 hour(s))  Culture, blood (Routine x 2)     Status: None   Collection Time: 06/12/17 12:38 PM  Result Value Ref Range Status   Specimen Description RIGHT ANTECUBITAL  Final   Special Requests   Final    BOTTLES DRAWN AEROBIC AND ANAEROBIC Blood Culture adequate volume   Culture NO GROWTH 5 DAYS  Final   Report Status 06/17/2017 FINAL  Final  Culture, blood (Routine x 2)     Status: None   Collection Time: 06/12/17 12:40 PM  Result Value Ref Range Status   Specimen Description BLOOD RIGHT ARM  Final   Special Requests   Final    BOTTLES DRAWN AEROBIC AND ANAEROBIC Blood Culture results may not be optimal due to an inadequate volume of blood received in culture bottles   Culture NO GROWTH 5 DAYS  Final   Report Status 06/17/2017 FINAL  Final  Acid Fast Smear (AFB)     Status: Abnormal   Collection Time: 06/13/17 11:30 AM  Result Value Ref Range Status   AFB Specimen Processing Concentration  Final   Acid Fast Smear Positive (A)  Final    Comment: (NOTE) Fluorescent smear report: 4+ positive smear. Key: 4+ = more than 36 acid-fast bacilli per  field at 400x magnification. Culture results to follow. Performed At: [facility] [address] [place], [place] [id number] Jolene Schimke MD WU:[id number]    Source (AFB) EXPECTORATED SPUTUM  Final  Acid Fast Smear (AFB)     Status: Abnormal   Collection Time: 06/14/17  3:54 AM  Result Value Ref Range Status   AFB Specimen Processing Concentration  Final   Acid Fast Smear Positive (A)  Final    Comment: (NOTE) Fluorescent smear report: 4+ positive smear. Key: 4+ = more than 36 acid-fast bacilli per field at 400x magnification. Culture results to follow. Performed At: [facility] [address] [place], [place] [id number] Jolene Schimke MD VH:[id number]    Source (AFB) SPUTUM  Final     Scheduled Meds: . enoxaparin (LOVENOX) injection  30 mg Subcutaneous Q24H  . feeding supplement (ENSURE ENLIVE)  237 mL Oral TID  BM  . multivitamin with minerals  1 tablet Oral Daily   Continuous Infusions: . sodium chloride 75 mL/hr at 06/17/17 0522  . azithromycin 500 mg (06/16/17 1628)  . cefTRIAXone (ROCEPHIN)  IV Stopped (06/16/17 1513)    Procedures/Studies: Dg Chest 2 View  Result Date: 06/12/2017 CLINICAL DATA:  COUGH X SEVERAL MONTHS, SOB AND FEVER X COUPLE WEEKS, HTN, SMOKER 1/2 PK/DAY EXAM: CHEST  2 VIEW COMPARISON:  09/25/2016 FINDINGS: Since prior exam, bilateral patchy airspace and coarse interstitial type opacities have developed in both upper lobes and in the left lower lung. This involves the left upper lobe to the greatest degree. Lungs are hyperexpanded.  No pleural effusion or pneumothorax. Cardiac silhouette is normal in size. Left hilum is partly obscured by the contiguous upper lobe opacity. No convincing mediastinal or right hilar masses or adenopathy. Skeletal structures are demineralized but grossly intact. IMPRESSION: 1. New bilateral upper lobe and left lower lobe airspace and interstitial opacities consistent with multifocal pneumonia.  Opportunistic infections including tuberculosis should be considered in the differential diagnosis. Electronically Signed   By: Amie Portlandavid  Ormond M.D.   On: 06/12/2017 12:24    Rami Budhu, DO  [facility] Pager [phone]  If 7PM-7AM, please contact night-coverage [email or web address] Password [email or web address] 06/17/2017, 10:50 AM   LOS: 5 days

## 2017-06-17 NOTE — Plan of Care (Signed)
Pt AFB resulted overnight. Pt endorses activity intolerance related to performance of activities of daily living. Pt states "he is feeling better".

## 2017-06-18 DIAGNOSIS — J181 Lobar pneumonia, unspecified organism: Secondary | ICD-10-CM

## 2017-06-18 DIAGNOSIS — J189 Pneumonia, unspecified organism: Secondary | ICD-10-CM

## 2017-06-18 DIAGNOSIS — A150 Tuberculosis of lung: Secondary | ICD-10-CM

## 2017-06-18 LAB — COMPREHENSIVE METABOLIC PANEL
ALK PHOS: 108 U/L (ref 38–126)
ALT: 31 U/L (ref 17–63)
ANION GAP: 10 (ref 5–15)
AST: 32 U/L (ref 15–41)
Albumin: 2 g/dL — ABNORMAL LOW (ref 3.5–5.0)
BILIRUBIN TOTAL: 0.8 mg/dL (ref 0.3–1.2)
BUN: 12 mg/dL (ref 6–20)
CALCIUM: 8.4 mg/dL — AB (ref 8.9–10.3)
CO2: 24 mmol/L (ref 22–32)
CREATININE: 0.6 mg/dL — AB (ref 0.61–1.24)
Chloride: 93 mmol/L — ABNORMAL LOW (ref 101–111)
Glucose, Bld: 99 mg/dL (ref 65–99)
Potassium: 4.1 mmol/L (ref 3.5–5.1)
SODIUM: 127 mmol/L — AB (ref 135–145)
TOTAL PROTEIN: 6 g/dL — AB (ref 6.5–8.1)

## 2017-06-18 LAB — CBC
HCT: 29 % — ABNORMAL LOW (ref 39.0–52.0)
HEMOGLOBIN: 9.4 g/dL — AB (ref 13.0–17.0)
MCH: 25 pg — ABNORMAL LOW (ref 26.0–34.0)
MCHC: 32.4 g/dL (ref 30.0–36.0)
MCV: 77.1 fL — ABNORMAL LOW (ref 78.0–100.0)
PLATELETS: 593 10*3/uL — AB (ref 150–400)
RBC: 3.76 MIL/uL — AB (ref 4.22–5.81)
RDW: 15.9 % — ABNORMAL HIGH (ref 11.5–15.5)
WBC: 16.5 10*3/uL — AB (ref 4.0–10.5)

## 2017-06-18 LAB — MTB NAA WITHOUT AFB CULTURE: SOURCE: POSITIVE — AB

## 2017-06-18 MED ORDER — SODIUM CHLORIDE 1 G PO TABS
1.0000 g | ORAL_TABLET | Freq: Two times a day (BID) | ORAL | Status: DC
  Administered 2017-06-18 – 2017-06-19 (×2): 1 g via ORAL
  Filled 2017-06-18 (×8): qty 1

## 2017-06-18 NOTE — Progress Notes (Signed)
PROGRESS NOTE  Scott QuantClarence Ferguson ZOX:[id number] DOB: 04/08/1951 DOA: 06/12/2017 PCP: Aliene BeamsHagler, [name], MD  Brief History:  66 year old male with a history of hypertension, hyperlipidemia, and cocaine abuse presented with 2-week history of worsening shortness of breathand cough with brown sputum. The patient also has had anorexia with unintentional weight loss. He states that he has lost 10 pounds in the past week. He complains of fevers and chills with soaking night sweats. The patient previously smoked cocaine, but states that he has not used in 5 months. He quit smoking tobacco approximately 3weeks prior to admission. The patient denies any recent TB exposures. He was previously incarcerated, but this was over 20 years ago. Patient went to see his primary care provider on the day of admission, and because of fever and tachycardia, the patient was referred to the emergency department. After admission, the patient was placed in respiratory isolation with sputum for AFB collected. The patient was empirically started on azithromycin and ceftriaxone. Once AFB sputum stains were positive, the patient was started on 4 drug therapy for presumptive MTB.  Azithromycin and ceftriaxone were discontinued.  Nucleic acid application was ordered on the isolate from the sputum and confirmed MTB infection.   [facility], and they helped coordinated the patient's discharge home to start direct observed therapy (DOT).    Assessment/Plan: Lobar pneumonia--Mycobacteria tuberculosis -sputum positive for 4+ AFB -06/17/17--started 4 drug therapy -[facility]--spoke with TB nurse Rella LarveWendy Trogdon--they will follow patient patient for DOT and recheck AFB sputum in 2 weeks -11/8--[facility]-->ok to d/c 11/9 if stable -NAA to MTB sputum-->confirms MTB -HIV negative -d/c  ceftriaxone and azithromycin -Quantiferon-TB--pending  Hyponatremia -due to poor solute intake and a degree of SIADH due to acute medical illness -check serum osm--274 -check urine osm--310 -FeNa--1.11% -little to no improvement with IVF  Severe protein calorie malnutrition -continue supplements  Hx cocaine use -UDS--negative    Disposition Plan:   Home 11/9 if stable Family Communication:   Sister updated at bedside--11/8--Total time spent 35 minutes.  Greater than 50% spent face to face counseling and coordinating care.   Consultants:  none  Code Status:  FULL   DVT Prophylaxis:    Lovenox   Procedures: As Listed in Progress Note Above  Antibiotics: Ceftriaxone 11/2>>>11/6 Azithromycin 11/2>>>11/6 RIPE 11/7>>>      Subjective: Patient denies fevers, chills, headache, chest pain, dyspnea, nausea, vomiting, diarrhea, abdominal pain, dysuria, hematuria, hematochezia, and melena.  He still has some mild dyspnea on exertion.  He states that  his cough is a little bit better.   Objective: Vitals:   06/17/17 1321 06/17/17 1956 06/18/17 0700 06/18/17 1547  BP: (!) 104/59 116/73 95/63 (!) 90/46  Pulse: (!) 112 (!) 113 (!) 101 (!) 107  Resp: 18 16 18 18   Temp: (!) 100.6 F (38.1 C) 98.2 F (36.8 C) 98.7 F (37.1 C) 99.5 F (37.5 C)  TempSrc: Oral Oral Oral Oral  SpO2: 97% 95% 96% 99%  Weight:      Height:        Intake/Output Summary (Last 24 hours) at 06/18/2017 1625 Last data filed at 06/18/2017 1604 Gross per 24 hour  Intake 2592.5 ml  Output 3525 ml  Net -932.5 ml   Weight change:  Exam:   General:  Pt is alert, follows commands appropriately, not in acute distress  HEENT: No icterus, No thrush, No neck mass, [place]/AT  Cardiovascular: RRR, S1/S2, no rubs, no gallops  Respiratory:, Bilateral rales, Left greater than right.  No wheezing.  Abdomen: Soft/+BS, non tender, non distended, no guarding  Extremities: No edema, No lymphangitis, No  petechiae, No rashes, no synovitis   Data Reviewed: I have personally reviewed following labs and imaging studies Basic Metabolic Panel: Recent Labs  Lab 06/12/17 1236 06/15/17 0601 06/17/17 0410 06/18/17 0603  NA 130* 130* 131* 127*  K 3.8 3.8 4.2 4.1  CL 91* 99* 98* 93*  CO2 25 24 23 24   GLUCOSE 97 103* 103* 99  BUN 12 9 11 12   CREATININE 0.85 0.60* 0.60* 0.60*  CALCIUM 9.4 8.0* 8.3* 8.4*   Liver Function Tests: Recent Labs  Lab 06/12/17 1236 06/18/17 0603  AST 19 32  ALT 20 31  ALKPHOS 143* 108  BILITOT 0.6 0.8  PROT 7.6 6.0*  ALBUMIN 2.9* 2.0*   No results for input(s): LIPASE, AMYLASE in the last 168 hours. No results for input(s): AMMONIA in the last 168 hours. Coagulation Profile: Recent Labs  Lab 06/12/17 1236  INR 1.06   CBC: Recent Labs  Lab 06/12/17 1236 06/15/17 0601 06/16/17 0507 06/17/17 0410 06/18/17 0603  WBC 14.5* 13.2* 17.7* 15.2* 16.5*  NEUTROABS 12.3*  --   --   --   --   HGB 11.7* 9.9* 9.8* 9.8* 9.4*  HCT 37.1* 31.2* 30.9* 30.0* 29.0*  MCV 76.3* 78.2 77.4* 77.7* 77.1*  PLT PLATELET CLUMPS NOTED ON SMEAR, COUNT APPEARS ADEQUATE 699* 602* 606* 593*   Cardiac Enzymes: No results for input(s): CKTOTAL, CKMB, CKMBINDEX, TROPONINI in the last 168 hours. BNP: Invalid input(s): POCBNP CBG: No results for input(s): GLUCAP in the last 168 hours. HbA1C: No results for input(s): HGBA1C in the last 72 hours. Urine analysis: No results found for: COLORURINE, APPEARANCEUR, LABSPEC, PHURINE, GLUCOSEU, HGBUR, BILIRUBINUR, KETONESUR, PROTEINUR, UROBILINOGEN, NITRITE, LEUKOCYTESUR Sepsis Labs: @LABRCNTIP (procalcitonin:4,lacticidven:4) ) Recent Results (from the past 240 hour(s))  Culture, blood (Routine x 2)     Status: None   Collection Time: 06/12/17 12:38 PM  Result Value Ref Range Status   Specimen Description RIGHT ANTECUBITAL  Final   Special Requests   Final    BOTTLES DRAWN AEROBIC AND ANAEROBIC Blood Culture adequate volume    Culture NO GROWTH 5 DAYS  Final   Report Status 06/17/2017 FINAL  Final  Culture, blood (Routine x 2)     Status: None   Collection Time: 06/12/17 12:40 PM  Result Value Ref Range Status   Specimen Description BLOOD RIGHT ARM  Final   Special Requests   Final    BOTTLES DRAWN AEROBIC AND ANAEROBIC Blood Culture results may not be optimal due to an inadequate volume of blood received in culture bottles   Culture NO GROWTH 5 DAYS  Final   Report Status 06/17/2017 FINAL  Final  Acid Fast Smear (AFB)     Status: Abnormal   Collection Time: 06/13/17 11:30 AM  Result Value Ref Range Status   AFB Specimen Processing Concentration  Final   Acid Fast Smear Positive (A)  Final    Comment: (NOTE) Fluorescent smear report: 4+ positive smear. Key: 4+ = more than 36 acid-fast bacilli per field at 400x magnification. Culture results to follow. Performed At: [facility] [place] [address] [place], [place] [id number] Jolene SchimkeNagendra Sanjai MD NW:[id number]:[phone]    Source (AFB) EXPECTORATED SPUTUM  Final  Acid Fast Smear (AFB)     Status: Abnormal   Collection Time: 06/14/17  3:54 AM  Result Value Ref Range Status   AFB Specimen Processing Concentration  Final   Acid Fast Smear Positive (A)  Final    Comment: (NOTE) Fluorescent smear report: 4+ positive smear. Key: 4+ = more than 36 acid-fast bacilli per field at 400x magnification. Culture results to follow. Performed At: [facility] [place] [address] [place], [place] [id number] Jolene Schimke MD WU:[id number]    Source (AFB) SPUTUM  Final  Acid Fast Smear (AFB)     Status: Abnormal   Collection Time: 06/16/17  4:30 PM  Result Value Ref Range Status   AFB Specimen Processing Concentration  Final   Acid Fast Smear Positive (A)  Final    Comment: (NOTE) 3+, 4-36 acid-fast bacilli per field at 400X magnification, fluorescent smear Reported to Lauren B on 06/17/17 at 1705.LJL Faxed to [phone].LJL Performed At: [facility] [address] [place], [place] [id number] Jolene Schimke MD MW:[id number]    Source (AFB) SPUTUM  Final     Scheduled Meds: . enoxaparin (LOVENOX) injection  30 mg Subcutaneous Q24H  . ethambutol  800 mg Oral Daily  . feeding supplement (ENSURE ENLIVE)  237 mL Oral TID BM  . isoniazid  300 mg Oral Daily  . multivitamin with minerals  1 tablet Oral Daily  . pyrazinamide  1,000 mg Oral Daily  . vitamin B-6  50 mg Oral Daily  . rifampin  600 mg Oral Daily   Continuous Infusions: . sodium chloride 75 mL/hr at 06/18/17 0759    Procedures/Studies: Dg Chest 2 View  Result Date: 06/12/2017 CLINICAL DATA:  COUGH X SEVERAL MONTHS, SOB AND FEVER X COUPLE WEEKS, HTN, SMOKER 1/2 PK/DAY EXAM: CHEST  2 VIEW COMPARISON:  09/25/2016 FINDINGS: Since prior exam, bilateral patchy airspace and coarse interstitial type opacities have developed in both upper lobes and in the left lower lung. This involves the left upper lobe to the greatest degree. Lungs are hyperexpanded.  No pleural effusion or pneumothorax. Cardiac silhouette is normal in size. Left hilum is partly obscured by the contiguous upper lobe opacity. No convincing mediastinal or right hilar masses or adenopathy. Skeletal structures are demineralized but grossly intact. IMPRESSION: 1. New bilateral upper lobe and left lower lobe airspace and interstitial opacities consistent with multifocal pneumonia. Opportunistic infections including tuberculosis should be considered in the differential diagnosis. Electronically Signed   By: Amie Portland M.D.   On: 06/12/2017 12:24    Chelci Wintermute, DO  [facility] Pager [phone]  If 7PM-7AM, please contact night-coverage [email or web address] Password [email or web address] 06/18/2017, 4:25 PM   LOS: 6 days

## 2017-06-18 NOTE — Clinical Social Work Note (Signed)
Patient reports that he has not been staying in the home for the past three weeks (located at [address], [place]) because of the condition of the home. He stated that he has been staying with his sister, Deborra MedinaYvonne Kory, [phone], who lives down the street. He states that his sister checked on the hous yesterday and reported that she does not think that the home is livable.  Patient provided permission for LCSW to speak with his sister.  LCSW spoke with Deborra MedinaYvonne Marti, sister [phone]. She stated that patient has not been staying with her for the past three weeks. She stated that patient has been [address], which is down the street from her. She states that patient comes to her house to pick up plates of food and that she had not been to his home in quite some time although she and her sister do own the home. She stated that the outside of the house was renovated (new windows, roof and porch in 2012). She stated that patient has not taken care of the inside of the home and that is it very unclean. She stated that she and her sister had gone to the home on yesterday to clean up the house and that they were unable to clean due to the level of uncleanliness they encountered. She stated that she had no idea that patient has allow the home to get in the unclean condition it was in.  She stated that the home has lights, water, a wood stove and patient has space heaters.   LCSW spoke with Lonie PeakWendy Trogden, [facility], [phone]. She stated that she had spoken to Ms. Maisie Fushomas and that Ms. Maisie Fushomas had agreed to allow patient to return to the home while she sought other housing for patient. (Patient had previously told Toniann FailWendy that Ms. Maisie Fushomas would not let him return to the home). Ms. Micael Hampshirerogden indicated that she would begin working on finding patient housing. She stated that patient can return to the home on tomorrow.     LCSW spoke with patient's niece  who inquired about patient being appropriate for medicaid. LCSW provided the financial counselor's phone number to start a Medicaid Application. She reiterated that her aunt had not been to the home in a very long time and was unaware of the living conditions that patient had chosen to liven in. She also apologized for patient if he had been angry/argumentative with staff.    LCSW signing off.        Gracia Saggese, Juleen ChinaHeather D, LCSW

## 2017-06-18 NOTE — Progress Notes (Signed)
CRITICAL VALUE ALERT  Critical Value:  Positive TB pcr  Date & Time Notied:  06/18/17 0600  Provider Notified:Hobbs  Orders Received/Actions taken: no new orders

## 2017-06-18 NOTE — Discharge Summary (Signed)
Physician Discharge Summary  Clinton QuantClarence Krupka ZOX:[id number] DOB: 07/16/1951 DOA: 06/12/2017  PCP: Aliene BeamsHagler, [name], MD  Admit date: 06/12/2017 Discharge date: 06/19/2017  Admitted From: HOME Disposition:  Home  Recommendations for Outpatient Follow-up:  1. Follow up with PCP in 1-2 weeks 2. Please obtain LFTs/BMP/CBC in one week    Discharge Condition: Stable CODE STATUS:FULL Diet recommendation: Regular   Brief/Interim Summary: 66 year old male with a history of hypertension, hyperlipidemia, and cocaine abuse presented with 2-week history of worsening shortness of breathand cough with brown sputum. The patient also has had anorexia with unintentional weight loss. He states that he has lost 10 pounds in the past week. He complains of fevers and chills with soaking night sweats. The patient previously smoked cocaine, but states that he has not used in 5 months. He quit smoking tobacco approximately 3weeks prior to admission. The patient denies any recent TB exposures. He was previously incarcerated, but this was over 20 years ago. Patient went to see his primary care provider on the day of admission, and because of fever and tachycardia, the patient was referred to the emergency department. After admission, the patient was placed in respiratory isolation with sputum for AFB collected. The patient was empirically started on azithromycin and ceftriaxone. Once AFB sputum stains were positive, the patient was started on 4 drug therapy for presumptive MTB.  Azithromycin and ceftriaxone were discontinued.  Nucleic acid application was ordered on the isolate from the sputum and confirmed MTB infection.   [facility], and they helped coordinated the patient's discharge home to start direct observed therapy (DOT).    Discharge Diagnoses:  Lobar pneumonia--Mycobacteria tuberculosis -sputum positive for 4+ AFB -06/17/17--started4 drug therapy -[facility]spoke with TB nurse Rella LarveWendy Trogdon--they will follow patient patient for DOT and recheck AFB sputum in 2 weeks -11/8--[facility]-->ok to d/c 11/9 if stable -NAA to MTB sputum-->confirms MTB -HIV negative -d/c ceftriaxone and azithromycin -Quantiferon-TB--pending  Hyponatremia -due to poor solute intake and a degree of SIADH due to acute medical illness -check serum osm--274 -check urine osm--310 -FeNa--1.11% -little to no improvement with IVF, but stable -repeat BMP in one week after d/c  Severe protein calorie malnutrition -continue supplements  Hx cocaine use -UDS--negative     Discharge Instructions   Allergies as of 06/19/2017      Reactions   Penicillins Other (See Comments)   Childhood reaction. States that he can only take orally and not by injection. Has patient had a PCN reaction causing immediate rash, facial/tongue/throat swelling, SOB or lightheadedness with hypotension: Unknown Has patient had a PCN reaction causing severe rash involving mucus membranes or skin necrosis: Unknown Has patient had a PCN reaction that required hospitalization: Unknown Has patient had a PCN reaction occurring within the last 10 years: No If all of the above answers are "NO", then may       Medication List    TAKE these medications   ethambutol 400 MG tablet Commonly known as:  MYAMBUTOL Take 2 tablets (800 mg total) daily by mouth.   feeding supplement (ENSURE ENLIVE) Liqd Take 237 mLs 3 (three) times daily between meals by mouth.   ibuprofen 200 MG tablet Commonly known as:  ADVIL,MOTRIN Take 400-600 mg by mouth every 6 (six) hours as needed for moderate pain.   isoniazid 300 MG tablet Commonly known as:  NYDRAZID Take 1 tablet (300 mg total) daily by mouth.   pyrazinamide 500 MG tablet Take 2 tablets (  1,000 mg total) daily by mouth.   pyridOXINE 50  MG tablet Commonly known as:  B-6 Take 1 tablet (50 mg total) daily by mouth.   rifampin 300 MG capsule Commonly known as:  RIFADIN Take 1 capsule (300 mg total) daily by mouth.   rifampin 150 MG capsule Commonly known as:  RIFADIN Take 1 capsule (150 mg total) daily for 30 doses by mouth. Take with 300 mg capsule to total 450 mg       Allergies  Allergen Reactions  . Penicillins Other (See Comments)    Childhood reaction. States that he can only take orally and not by injection. Has patient had a PCN reaction causing immediate rash, facial/tongue/throat swelling, SOB or lightheadedness with hypotension: Unknown Has patient had a PCN reaction causing severe rash involving mucus membranes or skin necrosis: Unknown Has patient had a PCN reaction that required hospitalization: Unknown Has patient had a PCN reaction occurring within the last 10 years: No If all of the above answers are "NO", then may     Consultations:  none   Procedures/Studies: Dg Chest 2 View  Result Date: 06/12/2017 CLINICAL DATA:  COUGH X SEVERAL MONTHS, SOB AND FEVER X COUPLE WEEKS, HTN, SMOKER 1/2 PK/DAY EXAM: CHEST  2 VIEW COMPARISON:  09/25/2016 FINDINGS: Since prior exam, bilateral patchy airspace and coarse interstitial type opacities have developed in both upper lobes and in the left lower lung. This involves the left upper lobe to the greatest degree. Lungs are hyperexpanded.  No pleural effusion or pneumothorax. Cardiac silhouette is normal in size. Left hilum is partly obscured by the contiguous upper lobe opacity. No convincing mediastinal or right hilar masses or adenopathy. Skeletal structures are demineralized but grossly intact. IMPRESSION: 1. New bilateral upper lobe and left lower lobe airspace and interstitial opacities consistent with multifocal pneumonia. Opportunistic infections including tuberculosis should be considered in the differential diagnosis. Electronically Signed   By: Amie Portland  M.D.   On: 06/12/2017 12:24        Discharge Exam: Vitals:   06/19/17 0509 06/19/17 0511  BP: (!) 81/54 95/66  Pulse: (!) 102 (!) 101  Resp: 18   Temp: 99 F (37.2 C)   SpO2: 96%    Vitals:   06/18/17 1633 06/18/17 1900 06/19/17 0509 06/19/17 0511  BP: (!) 96/55 (!) 90/57 (!) 81/54 95/66  Pulse: (!) 101 100 (!) 102 (!) 101  Resp:  18 18   Temp:  98.3 F (36.8 C) 99 F (37.2 C)   TempSrc:  Oral Oral   SpO2:  99% 96%   Weight:      Height:        General: Pt is alert, awake, not in acute distress Cardiovascular: RRR, S1/S2 +, no rubs, no gallops Respiratory: CTA bilaterally, no wheezing, no rhonchi Abdominal: Soft, NT, ND, bowel sounds + Extremities: no edema, no cyanosis   The results of significant diagnostics from this hospitalization (including imaging, microbiology, ancillary and laboratory) are listed below for reference.    Significant Diagnostic Studies: Dg Chest 2 View  Result Date: 06/12/2017 CLINICAL DATA:  COUGH X SEVERAL MONTHS, SOB AND FEVER X COUPLE WEEKS, HTN, SMOKER 1/2 PK/DAY EXAM: CHEST  2 VIEW COMPARISON:  09/25/2016 FINDINGS: Since prior exam, bilateral patchy airspace and coarse interstitial type opacities have developed in both upper lobes and in the left lower lung. This involves the left upper lobe to the greatest degree. Lungs are hyperexpanded.  No pleural effusion or pneumothorax. Cardiac silhouette is  normal in size. Left hilum is partly obscured by the contiguous upper lobe opacity. No convincing mediastinal or right hilar masses or adenopathy. Skeletal structures are demineralized but grossly intact. IMPRESSION: 1. New bilateral upper lobe and left lower lobe airspace and interstitial opacities consistent with multifocal pneumonia. Opportunistic infections including tuberculosis should be considered in the differential diagnosis. Electronically Signed   By: Amie Portlandavid  Ormond M.D.   On: 06/12/2017 12:24     Microbiology: Recent Results (from the  past 240 hour(s))  Culture, blood (Routine x 2)     Status: None   Collection Time: 06/12/17 12:38 PM  Result Value Ref Range Status   Specimen Description RIGHT ANTECUBITAL  Final   Special Requests   Final    BOTTLES DRAWN AEROBIC AND ANAEROBIC Blood Culture adequate volume   Culture NO GROWTH 5 DAYS  Final   Report Status 06/17/2017 FINAL  Final  Culture, blood (Routine x 2)     Status: None   Collection Time: 06/12/17 12:40 PM  Result Value Ref Range Status   Specimen Description BLOOD RIGHT ARM  Final   Special Requests   Final    BOTTLES DRAWN AEROBIC AND ANAEROBIC Blood Culture results may not be optimal due to an inadequate volume of blood received in culture bottles   Culture NO GROWTH 5 DAYS  Final   Report Status 06/17/2017 FINAL  Final  Acid Fast Smear (AFB)     Status: Abnormal   Collection Time: 06/13/17 11:30 AM  Result Value Ref Range Status   AFB Specimen Processing Concentration  Final   Acid Fast Smear Positive (A)  Final    Comment: (NOTE) Fluorescent smear report: 4+ positive smear. Key: 4+ = more than 36 acid-fast bacilli per field at 400x magnification. Culture results to follow. Performed At: [facility] [address] [place], [place] [id number] Jolene SchimkeNagendra Sanjai MD WU:[phone]    Source (AFB) EXPECTORATED SPUTUM  Final  Acid Fast Smear (AFB)     Status: Abnormal   Collection Time: 06/14/17  3:54 AM  Result Value Ref Range Status   AFB Specimen Processing Concentration  Final   Acid Fast Smear Positive (A)  Final    Comment: (NOTE) Fluorescent smear report: 4+ positive smear. Key: 4+ = more than 36 acid-fast bacilli per field at 400x magnification. Culture results to follow. Performed At: [facility] [address] [place], [place] [id number] Jolene SchimkeNagendra Sanjai MD VH:[phone]    Source (AFB) SPUTUM  Final  Acid Fast Smear (AFB)     Status: Abnormal   Collection Time: 06/16/17  4:30 PM  Result Value Ref Range Status   AFB  Specimen Processing Concentration  Final   Acid Fast Smear Positive (A)  Final    Comment: (NOTE) 3+, 4-36 acid-fast bacilli per field at 400X magnification, fluorescent smear Reported to Lauren B on 06/17/17 at 1705.[email or web address] Faxed to [phone].[email or web address] Performed At: [facility] [address] [place], [place] [id number] Jolene SchimkeNagendra Sanjai MD HK:[phone]    Source (AFB) SPUTUM  Final     Labs: Basic Metabolic Panel: Recent Labs  Lab 06/15/17 0601 06/17/17 0410 06/18/17 0603 06/19/17 0620  NA 130* 131* 127*  --   K 3.8 4.2 4.1  --   CL 99* 98* 93*  --   CO2 24 23 24   --   GLUCOSE 103* 103* 99  --   BUN 9 11 12   --   CREATININE 0.60* 0.60* 0.60* 0.60*  CALCIUM 8.0* 8.3* 8.4*  --  Liver Function Tests: Recent Labs  Lab 06/18/17 0603  AST 32  ALT 31  ALKPHOS 108  BILITOT 0.8  PROT 6.0*  ALBUMIN 2.0*   No results for input(s): LIPASE, AMYLASE in the last 168 hours. No results for input(s): AMMONIA in the last 168 hours. CBC: Recent Labs  Lab 06/15/17 0601 06/16/17 0507 06/17/17 0410 06/18/17 0603  WBC 13.2* 17.7* 15.2* 16.5*  HGB 9.9* 9.8* 9.8* 9.4*  HCT 31.2* 30.9* 30.0* 29.0*  MCV 78.2 77.4* 77.7* 77.1*  PLT 699* 602* 606* 593*   Cardiac Enzymes: No results for input(s): CKTOTAL, CKMB, CKMBINDEX, TROPONINI in the last 168 hours. BNP: Invalid input(s): POCBNP CBG: No results for input(s): GLUCAP in the last 168 hours.  Time coordinating discharge:  Greater than 30 minutes  Signed:  Kalie Cabral, DO [facility] Pager: [phone] 06/19/2017, 12:16 PM

## 2017-06-19 LAB — CREATININE, SERUM: CREATININE: 0.6 mg/dL — AB (ref 0.61–1.24)

## 2017-06-19 NOTE — Progress Notes (Signed)
Patient is to be discharged home and in stable condition. Patient's IV removed, WNL. Patient given discharged instructions and verbalized understanding. Patient will be escorted out by staff via wheelchair upon arrival of transportation.  Quita SkyeMorgan P Dishmon, RN

## 2017-06-19 NOTE — Care Management Important Message (Signed)
Important Message  Patient Details  Name: Scott Ferguson MRN: [id number] Date of Birth: 05/01/1951   Medicare Important Message Given:  Yes    Malcolm MetroChildress, Margene Cherian Demske, RN 06/19/2017, 12:31 PM

## 2017-06-19 NOTE — Care Management Note (Addendum)
Case Management Note  Patient Details  Name: [name] MRN: [id number] Date of Birth: 07/06/1951  Subjective/Objective:     Admitted with active TB. Pt discharging home today with self care. He is ind, he has insurance with drug coverage. He has PCP. He has been referred to the health dept, they will see him in his home for monitoring. His sister will bring him food.               Action/Plan: DC home today. CM has left VM for for Toniann FailWendy at [facility] to ask about precautions with transport home. Anticipate pt will be transported home by sister wearing mask. Discussed with RN, awaiting return call to confirm that this transport plan is appropriate.   Expected Discharge Date:  06/19/17               Expected Discharge Plan:  Home/Self Care  In-House Referral:  Clinical Social Work  Discharge planning Services  CM Consult  Post Acute Care Choice:  NA Choice offered to:  NA  Status of Service:  Completed, signed off  Malcolm MetroChildress, Nykeria Mealing Demske, RN 06/19/2017, 12:41 PM   Addendum: received return call from ThorpWendy. Pt is okay for transport in car with mask on and window cracked.

## 2017-06-21 LAB — QUANTIFERON-TB GOLD PLUS (RQFGPL)
QUANTIFERON TB1 AG VALUE: 0.42 [IU]/mL
QuantiFERON Mitogen Value: 0.26 IU/mL
QuantiFERON Nil Value: 0.04 IU/mL
QuantiFERON TB2 Ag Value: 0.44 IU/mL

## 2017-06-21 LAB — QUANTIFERON-TB GOLD PLUS: QuantiFERON-TB Gold Plus: POSITIVE — AB

## 2017-06-22 LAB — ACID FAST SMEAR (AFB, MYCOBACTERIA)

## 2017-06-23 LAB — MTB NAA WITHOUT AFB CULTURE: SOURCE: POSITIVE — AB

## 2017-06-25 ENCOUNTER — Telehealth: Payer: Self-pay | Admitting: Family Medicine

## 2017-06-25 NOTE — Telephone Encounter (Signed)
Please call patient and find out if he has been referred for TB treatment at an outside clinic.  I can see in the chart where he has been started on some TB medications, however I did not see a discharge note with the information on it.  Please see if there is a discharge letter/summary from his hospital visit.  Please call patient and see how he is doing at this time.

## 2017-06-25 NOTE — Telephone Encounter (Signed)
Spoke to patient. He states he is doing better, just can't stand being in isolation but he is doing hat he needs to to get better. He is receiving visits from TB nurse at health dept. He states that once he is out of isolation, he is scheduling an appointment with Dr. Tracie HarrierHagler to continue his visit. He also says thank you to Dr Tracie HarrierHagler for getting him to the hospital and helping him, as well as 'saving his life.'  D/c note is in chart.

## 2017-06-29 LAB — ACID FAST SMEAR (AFB, MYCOBACTERIA): Acid Fast Smear: POSITIVE — AB

## 2017-07-02 LAB — AFB ORGANISM ID BY DNA PROBE
M TUBERCULOSIS COMPLEX: POSITIVE — AB
M TUBERCULOSIS COMPLEX: POSITIVE — AB
M avium complex: NEGATIVE
M avium complex: NEGATIVE

## 2017-07-02 LAB — ACID FAST CULTURE WITH REFLEXED SENSITIVITIES: ACID FAST CULTURE - AFSCU3: POSITIVE — AB

## 2017-07-02 LAB — ACID FAST CULTURE WITH REFLEXED SENSITIVITIES (MYCOBACTERIA)

## 2017-07-08 LAB — ACID FAST CULTURE WITH REFLEXED SENSITIVITIES: ACID FAST CULTURE - AFSCU3: POSITIVE — AB

## 2017-07-08 LAB — MTB SUSCEPTIBILITY BROTH, REFLEXED

## 2017-07-08 LAB — AFB ORGANISM ID BY DNA PROBE
M AVIUM COMPLEX: NEGATIVE
M TUBERCULOSIS COMPLEX: POSITIVE — AB

## 2017-09-29 LAB — ACID FAST SMEAR (AFB): ACID FAST SMEAR - AFSCU2: POSITIVE — AB

## 2017-09-29 LAB — ACID FAST CULTURE WITH REFLEXED SENSITIVITIES

## 2017-09-29 LAB — ACID FAST CULTURE WITH REFLEXED SENSITIVITIES (MYCOBACTERIA): Acid Fast Culture: POSITIVE — AB

## 2017-09-29 LAB — ACID FAST SMEAR (AFB, MYCOBACTERIA)

## 2017-10-05 DIAGNOSIS — R918 Other nonspecific abnormal finding of lung field: Secondary | ICD-10-CM | POA: Diagnosis not present

## 2017-10-14 DIAGNOSIS — A150 Tuberculosis of lung: Secondary | ICD-10-CM | POA: Diagnosis not present

## 2017-10-14 DIAGNOSIS — M5442 Lumbago with sciatica, left side: Secondary | ICD-10-CM | POA: Diagnosis not present

## 2017-10-14 DIAGNOSIS — M62838 Other muscle spasm: Secondary | ICD-10-CM | POA: Diagnosis not present

## 2017-10-14 DIAGNOSIS — Z23 Encounter for immunization: Secondary | ICD-10-CM

## 2017-10-14 DIAGNOSIS — M5441 Lumbago with sciatica, right side: Secondary | ICD-10-CM | POA: Diagnosis not present

## 2017-10-14 DIAGNOSIS — G8929 Other chronic pain: Secondary | ICD-10-CM | POA: Diagnosis not present

## 2017-10-16 ENCOUNTER — Ambulatory Visit: Payer: PPO

## 2017-10-16 ENCOUNTER — Encounter (INDEPENDENT_AMBULATORY_CARE_PROVIDER_SITE_OTHER): Payer: Self-pay

## 2017-10-20 ENCOUNTER — Other Ambulatory Visit: Payer: Self-pay

## 2017-10-20 ENCOUNTER — Ambulatory Visit (INDEPENDENT_AMBULATORY_CARE_PROVIDER_SITE_OTHER): Payer: PPO | Admitting: Family Medicine

## 2017-10-20 ENCOUNTER — Encounter: Payer: Self-pay | Admitting: Family Medicine

## 2017-10-20 VITALS — BP 126/84 | HR 118 | Temp 98.6°F | Resp 16 | Ht 66.0 in | Wt 126.8 lb

## 2017-10-20 DIAGNOSIS — M5441 Lumbago with sciatica, right side: Secondary | ICD-10-CM | POA: Diagnosis not present

## 2017-10-20 DIAGNOSIS — Z23 Encounter for immunization: Secondary | ICD-10-CM

## 2017-10-20 DIAGNOSIS — M5442 Lumbago with sciatica, left side: Secondary | ICD-10-CM

## 2017-10-20 DIAGNOSIS — A150 Tuberculosis of lung: Secondary | ICD-10-CM

## 2017-10-20 DIAGNOSIS — G8929 Other chronic pain: Secondary | ICD-10-CM | POA: Diagnosis not present

## 2017-10-20 DIAGNOSIS — M62838 Other muscle spasm: Secondary | ICD-10-CM | POA: Diagnosis not present

## 2017-10-20 MED ORDER — MELOXICAM 7.5 MG PO TABS: 7.5000 mg | ORAL_TABLET | Freq: Every day | ORAL | 0 refills | Status: DC

## 2017-10-20 MED ORDER — CYCLOBENZAPRINE HCL 5 MG PO TABS: 5.0000 mg | ORAL_TABLET | Freq: Three times a day (TID) | ORAL | 1 refills | Status: DC | PRN

## 2017-10-20 NOTE — Progress Notes (Signed)
Patient ID: Scott Ferguson, male    DOB: 07-15-1951, 67 y.o.   MRN: [id number]  Chief Complaint  Patient presents with  . Follow-up    Allergies Penicillins  Subjective:   Scott Ferguson is a 67 y.o. male who presents to [facility] today.  HPI Scott Ferguson presents today to discuss his history of chronic back pain.  He reports that he is here because of back pain in lower back.  He believes that the pain seems to be getting worse, has been there for years. Reports that some days he feels like he cannot get up out of bed because of the pain.  He reports that the pain is not always each day but can fluctuate.  He reports that it can occur in the center of his back and involve the muscles to the side of his back.   Was seen by Dr. Alvester Morin and given two epidural steroid injections in the past.  He reports that he did get some mild relief from the shots.  He has been taking Motrin OTC for pain relief.  He reports that he only gets minimal improvement from this medication.  H side effects with the e denies any Motrin.  He denies any GI upset or swelling in his extremities.  He reports that he does not want surgery for his back.   MRI results reviewed with patient and his sister in the office today.  He reports that he understands the severity of his MRI and the degree of his disc disease.  He would like to have chronic narcotics to help deal with his pain.  He reports that he does get pain with the associated numbness and tingling in his legs.  He reports that this has not changed since he was last evaluated by physical medicine and rehab.  He reports he is urinating fine.  He denies any weakness in his lower extremities.  He is currently taking his medications to treat his pulmonary TB and is being followed by the health department.  He denies tobacco use.  He is drinking alcohol multiple days a week.  He is accompanied by his sister for the visit because he does not drive.    Past  Medical History:  Diagnosis Date  . Hyperlipidemia   . Hypertension   . Pulmonary TB     Past Surgical History:  Procedure Laterality Date  . KNEE SURGERY    . SECONDARY CLOSURE ARM      Family History  Problem Relation Age of Onset  . Cancer Mother        Deseased   . Heart attack Father        Deseased   . Hypertension Sister   . CAD Brother        Deseased   . CAD Sister   . Cancer Brother        Deseased      Social History   Socioeconomic History  . Marital status: Divorced    Spouse name: None  . Number of children: None  . Years of education: None  . Highest education level: None  Social Needs  . Financial resource strain: None  . Food insecurity - worry: None  . Food insecurity - inability: None  . Transportation needs - medical: None  . Transportation needs - non-medical: None  Occupational History  . None  Tobacco Use  . Smoking status: Current Every Day Smoker    Packs/day: 0.50  Types: Cigarettes  . Smokeless tobacco: Never Used  Substance and Sexual Activity  . Alcohol use: Yes    Comment: occasional  . Drug use: No  . Sexual activity: No  Other Topics Concern  . None  Social History Narrative  . None   Current Outpatient Medications on File Prior to Visit  Medication Sig Dispense Refill  . pyridOXINE (B-6) 50 MG tablet Take 1 tablet (50 mg total) daily by mouth. 30 tablet 0  . rifampin (RIFADIN) 300 MG capsule Take 1 capsule (300 mg total) daily by mouth. 30 capsule 0  . ethambutol (MYAMBUTOL) 400 MG tablet Take 2 tablets (800 mg total) daily by mouth. (Patient not taking: Reported on 10/20/2017) 60 tablet 0  . feeding supplement, ENSURE ENLIVE, (ENSURE ENLIVE) LIQD Take 237 mLs 3 (three) times daily between meals by mouth. (Patient not taking: Reported on 10/20/2017) 90 Bottle 0  . ibuprofen (ADVIL,MOTRIN) 200 MG tablet Take 400-600 mg by mouth every 6 (six) hours as needed for moderate pain.    Marland Kitchen isoniazid (NYDRAZID) 300 MG tablet Take  1 tablet (300 mg total) daily by mouth. (Patient not taking: Reported on 10/20/2017) 30 tablet 0  . pyrazinamide 500 MG tablet Take 2 tablets (1,000 mg total) daily by mouth. (Patient not taking: Reported on 10/20/2017) 60 tablet 0   No current facility-administered medications on file prior to visit.     Review of Systems  Constitutional: Negative for appetite change, chills, fever and unexpected weight change.  HENT: Negative for trouble swallowing and voice change.   Eyes: Negative for visual disturbance.  Respiratory: Negative for cough, chest tightness, shortness of breath and wheezing.   Cardiovascular: Negative for chest pain, palpitations and leg swelling.  Gastrointestinal: Negative for abdominal pain, diarrhea, nausea and vomiting.  Genitourinary: Negative for decreased urine volume, dysuria and frequency.  Musculoskeletal: Positive for back pain.  Skin: Negative for rash.  Neurological: Negative for dizziness, tremors, syncope, facial asymmetry, weakness and headaches.  Hematological: Negative for adenopathy. Does not bruise/bleed easily.     Objective:   BP 126/84 (BP Location: Left Arm, Patient Position: Sitting, Cuff Size: Normal)   Pulse (!) 118   Temp 98.6 F (37 C) (Temporal)   Resp 16   Ht 5\' 6"  (1.676 m)   Wt 126 lb 12 oz (57.5 kg)   SpO2 95%   BMI 20.46 kg/m   Physical Exam  Constitutional: He is oriented to person, place, and time. He appears well-developed and well-nourished.  Well-developed, well-nourished male in no acute distress.  In much better state of health and at his last visit.  Alert and oriented.  HENT:  Head: Normocephalic and atraumatic.  Eyes: Conjunctivae and EOM are normal. Pupils are equal, round, and reactive to light. No scleral icterus.  Neck: Normal range of motion. Neck supple. No thyromegaly present.  Cardiovascular: Normal rate, regular rhythm and normal heart sounds.  Pulmonary/Chest: Effort normal. He has rales.  Left-sided  rhonchi present.  Musculoskeletal: He exhibits no edema.  Neurological: He is alert and oriented to person, place, and time. No cranial nerve deficit.  Skin: Skin is warm, dry and intact. No pallor.  Psychiatric: He has a normal mood and affect. His behavior is normal. Thought content normal.  Vitals reviewed.    Assessment and Plan   1. Chronic bilateral low back pain with bilateral sciatica Long discussion today with patient and his sister regarding his diagnosis and back issues.  He has previously been seen by orthopedics  and physical medicine and rehab.  He has received epidural steroid injections for pain management.  In addition he has had MRI performed which revealed multilevel disc disease with nerve compression.  He is not sure that he would like to follow-up with orthopedics, physical medicine/rehab, or surgery at this time.  He is not interested in having any more x-rays or studies done.  He is not interested in having surgery performed.  We reviewed his MRI today, which was performed less than 6 months ago.  His symptoms have not changed.  I did tell patient that I would not prescribe him narcotics for pain management.  I do not believe that that is a good option for his pain control.  In addition, I spoke with him and his sister that I believe that he is at high risk for these medications secondary to his alcohol use.  I did ask him to refrain from Tylenol use at this time due to his TB medications and additive hepatotoxic effects.  He would like to try the meloxicam at this time.  He understands that he cannot use other NSAIDs with this medication.  He understands that he cannot specifically use Advil, ibuprofen, Goody's, Excedrin, Motrin.  During the office visit, we did discuss the risks of NSAIDs including risks of cardiovascular and thrombotic events related to NSAIDS. Discussed increased risk of AMI and CVA. Discussed risk of serious GI adverse events including bleeding, ulcers,  and perforation. Patient understands risks of this medication.  He understands that if combined with alcohol that these risks to increase.  - meloxicam (MOBIC) 7.5 MG tablet; Take 1 tablet (7.5 mg total) by mouth daily.  Dispense: 30 tablet; Refill: 0  2. Immunization due Vaccinations given - Flu Vaccine QUAD 6+ mos PF IM (Fluarix Quad PF) - Pneumococcal conjugate vaccine 13-valent IM  3. Muscle spasm We did discuss that due to his pain he can have some paraspinal muscle spasms.  He can use heat to the area.  I did tell him I would give him a prescription for muscle relaxers to try at this time.  We discussed sedative precautions of this medication.  He does not drive. - cyclobenzaprine (FLEXERIL) 5 MG tablet; Take 1 tablet (5 mg total) by mouth 3 (three) times daily as needed for muscle spasms.  Dispense: 30 tablet; Refill: 1  4. Pulmonary TB Continue medications as directed by health department.  It was recommended that patient follow back up in 1 month for recheck and for complete physical examination.  Office visit today was 30 minutes.  Greater than 50% of office visit spent counseling patient and coordinating his care.  It was left at the conclusion of the visit if he changes his mind and would like referral placed for his back that he will call our office. In addition to the above, he was counseled concerning worrisome signs and symptoms of low back pain.  He was told that if he developed any weakness, changes in urination, or any worrisome signs and symptoms to please go to the emergency department or contact medical help.  He voiced understanding  Return in about 4 weeks (around 11/17/2017). Aliene Beamsachel Julies Carmickle, MD 10/20/2017

## 2018-01-14 ENCOUNTER — Encounter: Payer: Self-pay | Admitting: Family Medicine

## 2018-01-15 ENCOUNTER — Encounter: Payer: Self-pay | Admitting: Family Medicine

## 2018-03-10 DIAGNOSIS — R7611 Nonspecific reaction to tuberculin skin test without active tuberculosis: Secondary | ICD-10-CM | POA: Diagnosis not present

## 2018-03-30 ENCOUNTER — Other Ambulatory Visit: Payer: Self-pay

## 2018-03-30 ENCOUNTER — Ambulatory Visit (INDEPENDENT_AMBULATORY_CARE_PROVIDER_SITE_OTHER): Payer: PPO | Admitting: Family Medicine

## 2018-03-30 ENCOUNTER — Encounter: Payer: Self-pay | Admitting: Family Medicine

## 2018-03-30 VITALS — BP 116/74 | HR 77 | Temp 98.5°F | Resp 12 | Ht 66.0 in | Wt 121.0 lb

## 2018-03-30 DIAGNOSIS — Z23 Encounter for immunization: Secondary | ICD-10-CM

## 2018-03-30 DIAGNOSIS — R05 Cough: Secondary | ICD-10-CM | POA: Diagnosis not present

## 2018-03-30 DIAGNOSIS — M5442 Lumbago with sciatica, left side: Secondary | ICD-10-CM

## 2018-03-30 DIAGNOSIS — G8929 Other chronic pain: Secondary | ICD-10-CM

## 2018-03-30 DIAGNOSIS — M5441 Lumbago with sciatica, right side: Secondary | ICD-10-CM | POA: Diagnosis not present

## 2018-03-30 DIAGNOSIS — A150 Tuberculosis of lung: Secondary | ICD-10-CM | POA: Diagnosis not present

## 2018-03-30 DIAGNOSIS — R059 Cough, unspecified: Secondary | ICD-10-CM

## 2018-03-30 NOTE — Progress Notes (Signed)
Patient ID: Scott Ferguson, male    DOB: [birth year], 67 y.o.   MRN: [id number]  Chief Complaint  Patient presents with  . Back Pain    still having back pain  . Tuberculosis    follow up after treatment    Allergies Penicillins  Subjective:   Scott Ferguson is a 67 y.o. male who presents to [facility] today.  HPI Here for follow up in relation to his TB.    He brings in paperwork.  Paperwork from the [facility] department of Health and [employer] today.  The paperwork states that he is an active TB case but he is completed his medication regimen on 03/20/2018.  The paperwork requests me to please review final chest x-ray reading.  Paperwork requests me to  note if I am requesting him to be evaluated by a another physician and to document that he does or does not have any signs and symptoms of TB.  They would like for me to fax this note to [phone].  Or if I have any questions to please call them.   Reports that he is still coughing. Reports that he is coughing on a daily basis but is not coughing up sputum. Does not get SOB with walking.  Reports that the cough is not as bad as it was when he was diagnosed.  But it has persisted. Appetite is better than it was when he is first diagnosed with TB but over the past month or so he has had a decreased appetite.  He does note that he has not had any Ensure over the past month because it was too expensive. No night sweats. No fevers.  Energy is pretty good. Still smoking cigarettes and drinking beer on a daily basis.   He also notes that he would like to go back and see Dr. Alvester Morin for his back. Reports that this has been a chronic issue and the medication that I gave him did not help. Reports that has had this pain in low back for a few years, since broke his hip with a fall.  No current medications.  He reports that his mood is good.  He reports he has enough money to buy his food.  He does live alone.  He  walked our office today.  He does not drive.  He denies any chest pain.  No palpitations.  Reports that his bowel movements are normal.  Plans on establishing care with another physician in the area. Reports he has never been seen by a pulmonologist.  Has smoked for many years.   Cough  This is a recurrent problem. The current episode started more than 1 month ago. The problem has been waxing and waning. The problem occurs every few hours. The cough is non-productive. Associated symptoms include weight loss. Pertinent negatives include no chest pain, chills, ear congestion, ear pain, fever, headaches, heartburn, hemoptysis, myalgias, nasal congestion, postnasal drip, rash, rhinorrhea, sore throat, shortness of breath, sweats or wheezing. Nothing aggravates the symptoms. Risk factors for lung disease include smoking/tobacco exposure. The treatment provided mild relief. His past medical history is significant for pneumonia.    Past Medical History:  Diagnosis Date  . Hyperlipidemia   . Hypertension   . Pulmonary TB     Past Surgical History:  Procedure Laterality Date  . KNEE SURGERY    . SECONDARY CLOSURE ARM      Family History  Problem Relation Age of Onset  . Cancer  Mother        Deseased   . Heart attack Father        Deseased   . Hypertension Sister   . CAD Brother        Deseased   . CAD Sister   . Cancer Brother        Deseased      Social History   Socioeconomic History  . Marital status: Divorced    Spouse name: Not on file  . Number of children: Not on file  . Years of education: Not on file  . Highest education level: Not on file  Occupational History  . Not on file  Social Needs  . Financial resource strain: Not on file  . Food insecurity:    Worry: Not on file    Inability: Not on file  . Transportation needs:    Medical: Not on file    Non-medical: Not on file  Tobacco Use  . Smoking status: Current Every Day Smoker    Packs/day: 0.50    Types:  Cigarettes  . Smokeless tobacco: Never Used  Substance and Sexual Activity  . Alcohol use: Yes    Comment: beer daily  . Drug use: No  . Sexual activity: Not Currently    Partners: Female  Lifestyle  . Physical activity:    Days per week: Not on file    Minutes per session: Not on file  . Stress: Not on file  Relationships  . Social connections:    Talks on phone: Not on file    Gets together: Not on file    Attends religious service: Not on file    Active member of club or organization: Not on file    Attends meetings of clubs or organizations: Not on file    Relationship status: Not on file  Other Topics Concern  . Not on file  Social History Narrative   Lives alone. Does have children. Works at odd jobs. Smokes. Does not drive.    Current Outpatient Medications on File Prior to Visit  Medication Sig Dispense Refill  . feeding supplement, ENSURE ENLIVE, (ENSURE ENLIVE) LIQD Take 237 mLs 3 (three) times daily between meals by mouth. 90 Bottle 0   No current facility-administered medications on file prior to visit.     Review of Systems  Constitutional: Positive for weight loss. Negative for appetite change, chills, fever and unexpected weight change.  HENT: Negative for ear pain, postnasal drip, rhinorrhea, sore throat, trouble swallowing and voice change.   Eyes: Negative for visual disturbance.  Respiratory: Positive for cough. Negative for hemoptysis, choking, chest tightness, shortness of breath and wheezing.   Cardiovascular: Negative for chest pain, palpitations and leg swelling.  Gastrointestinal: Negative for abdominal pain, diarrhea, heartburn, nausea and vomiting.  Genitourinary: Negative for decreased urine volume, dysuria and frequency.  Musculoskeletal: Negative for myalgias.  Skin: Negative for rash.  Neurological: Negative for dizziness, tremors, syncope, facial asymmetry, weakness and headaches.  Hematological: Negative for adenopathy. Does not  bruise/bleed easily.  Psychiatric/Behavioral: Negative for decreased concentration, dysphoric mood, sleep disturbance and suicidal ideas. The patient is not nervous/anxious.      Objective:   BP 116/74 (BP Location: Left Arm, Patient Position: Sitting, Cuff Size: Normal)   Pulse 77   Temp 98.5 F (36.9 C) (Temporal)   Resp 12   Ht 5\' 6"  (1.676 m)   Wt 121 lb 0.6 oz (54.9 kg)   SpO2 99% Comment: room air  BMI 19.54  kg/m  Lost five pounds since last visit.  Physical Exam  Constitutional: He appears well-developed and well-nourished.  Thin male, appearance older than stated age.   HENT:  Head: Normocephalic and atraumatic.  Mouth/Throat: Oropharynx is clear and moist.  Eyes: Pupils are equal, round, and reactive to light. Conjunctivae and EOM are normal. No scleral icterus.  Neck: Normal range of motion. Neck supple. No JVD present.  Cardiovascular: Normal rate, regular rhythm, normal heart sounds and intact distal pulses.  Pulmonary/Chest: Effort normal and breath sounds normal. No respiratory distress. He has no wheezes. He has no rales.  Abdominal: Soft. Bowel sounds are normal.  Neurological: He is alert.  Skin: Skin is warm and dry. Capillary refill takes less than 2 seconds.  Psychiatric: He has a normal mood and affect. His behavior is normal.   Dep[facility] screen [facility] 2[facility] 03/30/2018 10/20/2017  Decreased Interest 1 0  Down, Depressed, Hopeless 1 0  PHQ - 2 Score 2 0  Altered sleeping 1 -  Tired, decreased energy 1 -  Change in appetite 1 -  Feeling bad or failure about yourself  0 -  Trouble concentrating 0 -  Moving slowly or fidgety/restless 0 -  Suicidal thoughts 0 -  PHQ-9 Score 5 -  Difficult doing work/chores Somewhat difficult -   Records from health department reviewed today in office.  Chest x-ray which was performed on 03/10/2018 Final  impression: Pattern most consistent with posttreatment scarring and possible to rule out ongoing active  infection. Assessment and Plan  Persistent cough in a patient who has recently been diagnosed and completed treatment for pneumonia, tuberculous, with microscopic diagnosis Called and spoke with Wynonia LawmanWendy Trogdon, BSN.  I discussed with her that Scott Ferguson is still complaining of cough and since his last visit in our office he now has a decreased appetite and he has lost 5 pounds.  This could be due to the fact that he has quit drinking Ensure.  However, he is complaining of a persistent cough.  He does have a history significant for tobacco abuse.  I discussed that there are multiple etiologies which could be contributing to his cough.  Possible etiologies could include persistent active TB, COPD, reflux, other underlying lung pathology. I will be leaving this office and do not want patient to be lost to follow-up.  I believe at this time that he should be evaluated by pulmonary.  I am placing a referral for him to see Dr. Juanetta GoslingHawkins.  In addition, Toniann FailWendy states that his chest x-rays have been sent to the state office for review and evaluation.  She reports that she will still be following this patient.  His labs at all the records were reviewed today.  The 5 A's Model for treating Tobacco Use and Dependence was used today. I have identified and documented tobacco use status for this patient. I have urged the patient to quit tobacco use. At this time, the patient is unwilling and not ready to attempt to quit. I have provided patient with information regarding risks, cessation techniques, and interventions that might increase future attempts to quit smoking. I will plan on again addressing tobacco dependence at the next visit. Patient was asked to continue to decrease his alcohol use.   2. Chronic bilateral low back pain with bilateral sciatica Will place referral for patient to go back and get evaluation by Dr. Alvester MorinNewton. - Ambulatory referral to Physical Medicine Rehab  3. Need for immunization against  influenza Vaccination given today. -  Flu Vaccine QUAD 36+ mos IM  Referral placed to Dr. Juanetta Gosling.  Patient will follow-up with new primary care physician.  He was given information regarding Dr.Goshrani, he does prefer an office where he can walk to the office visits. Office visit was 30 minutes.  Greater than 20% of office visit was spent counseling and coordinating care. No follow-ups on file. Aliene Beams, MD 04/01/2018

## 2018-04-15 ENCOUNTER — Encounter: Payer: Self-pay | Admitting: Family Medicine

## 2018-04-16 ENCOUNTER — Encounter (INDEPENDENT_AMBULATORY_CARE_PROVIDER_SITE_OTHER): Payer: Self-pay | Admitting: Physical Medicine and Rehabilitation

## 2018-04-16 ENCOUNTER — Ambulatory Visit (INDEPENDENT_AMBULATORY_CARE_PROVIDER_SITE_OTHER): Payer: PPO | Admitting: Physical Medicine and Rehabilitation

## 2018-04-16 VITALS — BP 142/87 | HR 83 | Ht 66.0 in | Wt 122.0 lb

## 2018-04-16 DIAGNOSIS — M5116 Intervertebral disc disorders with radiculopathy, lumbar region: Secondary | ICD-10-CM

## 2018-04-16 DIAGNOSIS — M5416 Radiculopathy, lumbar region: Secondary | ICD-10-CM

## 2018-04-16 MED ORDER — GABAPENTIN 600 MG PO TABS: ORAL_TABLET | ORAL | 1 refills | Status: DC

## 2018-04-16 NOTE — Progress Notes (Signed)
 .  Numeric Pain Rating Scale and Functional Assessment Average Pain 8 Pain Right Now 6 My pain is intermittent, sharp and stabbing Pain is worse with: walking, bending and some activites Pain improves with: rest   In the last MONTH (on 0-10 scale) has pain interfered with the following?  1. General activity like being  able to carry out your everyday physical activities such as walking, climbing stairs, carrying groceries, or moving a chair?  Rating(5)  2. Relation with others like being able to carry out your usual social activities and roles such as  activities at home, at work and in your community. Rating(4)  3. Enjoyment of life such that you have  been bothered by emotional problems such as feeling anxious, depressed or irritable?  Rating(1)

## 2018-05-05 DIAGNOSIS — R9389 Abnormal findings on diagnostic imaging of other specified body structures: Secondary | ICD-10-CM | POA: Diagnosis not present

## 2018-05-05 DIAGNOSIS — Z8611 Personal history of tuberculosis: Secondary | ICD-10-CM | POA: Diagnosis not present

## 2018-05-05 DIAGNOSIS — I10 Essential (primary) hypertension: Secondary | ICD-10-CM | POA: Diagnosis not present

## 2018-05-05 DIAGNOSIS — J449 Chronic obstructive pulmonary disease, unspecified: Secondary | ICD-10-CM | POA: Diagnosis not present

## 2018-05-08 ENCOUNTER — Other Ambulatory Visit (INDEPENDENT_AMBULATORY_CARE_PROVIDER_SITE_OTHER): Payer: Self-pay | Admitting: Physical Medicine and Rehabilitation

## 2018-05-10 MED ORDER — GABAPENTIN 600 MG PO TABS: 300.0000 mg | ORAL_TABLET | Freq: Three times a day (TID) | ORAL | 0 refills | Status: DC

## 2018-05-10 NOTE — Telephone Encounter (Signed)
Please Advise

## 2018-05-10 NOTE — Addendum Note (Signed)
Addended by: Ashok Norris on: 05/10/2018 01:15 PM   Modules accepted: Orders

## 2018-05-14 ENCOUNTER — Encounter (INDEPENDENT_AMBULATORY_CARE_PROVIDER_SITE_OTHER): Payer: Self-pay | Admitting: Physical Medicine and Rehabilitation

## 2018-05-14 NOTE — Progress Notes (Signed)
Scott Ferguson - 67 y.o. male MRN [id number]  Date of birth: [birth year]  Office Visit Note: Visit Date: 04/16/2018 PCP: No primary care provider on file. Referred by: Aliene Beams, MD  Subjective: Chief Complaint  Patient presents with  . Lower Back - Pain  . Left Leg - Pain   HPI: Scott Ferguson is a 67 y.o. male who comes in today Continued chronic recalcitrant low back pain with some referral in the left hip and leg.  We saw the patient in the fall of last year and completed epidural injection with only mild relief.  He reports no new injury or falls or trauma.  No focal weakness.  He has pain that is chronic and constant in the lower back some referral on the left hip and leg.  Low back and left hip and leg pain worse with standing and ambulating better at rest.  He reports that nothing eases the pain and he rates his pain as an 8 out of 10.  Again constant with intermittent sharpness and stabbing.  He states that it does limit his activities.  He reports he felt like the injection was only minimally beneficial.  MRI has been performed of the lumbar spine and this is reviewed with the patient again and reviewed below but basically shows moderate multifactorial stenosis at L4-5 with left lateral disc protrusion at L5-S1 which I think both of those could be a culprit in his left hip and leg pain.  Overall he does have degenerative changes without severe nerve compression at all.  He did not indicate this to me but I did review the notes from his primary care physician and on his last office note in March he was requesting opioid medications for his pain management and really did not want any other interventions or surgery or testing etc.  Rightfully so Dr. Tracie Harrier felt like opioid medication would not be a great answer for the patient in terms of chronic pain management.  His spine condition would not warrant this either but he also has the complication of alcohol abuse as well as history of lung  problems including tuberculosis.  Review of Systems  Constitutional: Positive for malaise/fatigue. Negative for chills, fever and weight loss.  HENT: Negative for hearing loss and sinus pain.   Eyes: Negative for blurred vision, double vision and photophobia.  Respiratory: Negative for cough and shortness of breath.   Cardiovascular: Negative for chest pain, palpitations and leg swelling.  Gastrointestinal: Negative for abdominal pain, nausea and vomiting.  Genitourinary: Negative for flank pain.  Musculoskeletal: Positive for back pain. Negative for myalgias.  Skin: Negative for itching and rash.  Neurological: Positive for tingling. Negative for tremors, focal weakness and weakness.  Endo/Heme/Allergies: Negative.   Psychiatric/Behavioral: Negative for depression.  All other systems reviewed and are negative.  Otherwise per HPI.  Assessment & Plan: Visit Diagnoses:  1. Lumbar radiculopathy   2. Radiculopathy due to lumbar intervertebral disc disorder     Plan: Findings:  Chronic pain syndrome with recalcitrant low back pain with worsening over the last 10 months.  MRI evidence of multifactorial stenosis moderate without compression at L4-5 and left lateral disc protrusion at L5-S1.  Pain is consistent with pain in the spine radiating to the left.  Prior epidural injection was only minimally beneficial.  Discussed at length with him that he would be a surgical candidate potentially for decompression laminectomy of this area and that likely the hip and leg pain would resolve.  Talk  to him again about possible repeating the injection which she does not want to do.  From medication standpoint could start gabapentin and not talking about that at length and we will start that today.  He again is not a great candidate for any opioid chronic treatment and we really are offering that service here.  He may wish to return to his primary care physician and if they felt like chronic comprehensive pain  management was something he should look into there are several folks in the community and also at the larger tertiary care hospitals.    Meds & Orders:  Meds ordered this encounter  Medications  . DISCONTD: gabapentin (NEURONTIN) 600 MG tablet    Sig: Take 0.5 tabs by mouth at night for 7 nights then 0.5 tabs in morning and 0.5 tabs at night    Dispense:  90 tablet    Refill:  1   No orders of the defined types were placed in this encounter.   Follow-up: Return if symptoms worsen or fail to improve.   Procedures: No procedures performed  No notes on file   Clinical History: MRI LUMBAR SPINE WITHOUT CONTRAST 03/03/2017   TECHNIQUE: Multiplanar, multisequence MR imaging of the lumbar spine was performed. No intravenous contrast was administered.  COMPARISON: Lumbar radiographs 02/24/2017  FINDINGS: Segmentation: Normal  Alignment: Normal  Vertebrae: Negative for fracture or mass.  Conus medullaris: Extends to the L1-2 level and appears normal.  Paraspinal and other soft tissues: Negative  Disc levels:  L1-2: Diffuse disc bulging without significant stenosis  L2-3: Moderate disc degeneration with diffuse disc bulging. Mild spinal stenosis. Small left-sided disc protrusion with subarticular stenosis on the left which could affect the left L3 nerve root. Moderate foraminal encroachment bilaterally.  L3-4: Diffuse bulging of the disc without significant stenosis  L4-5: Moderate disc degeneration with diffuse disc bulging and spurring. Small left-sided disc protrusion extending caudally and compressing the left L4 nerve root in the foramen. Bilateral facet hypertrophy. Moderate spinal stenosis. Subarticular and foraminal encroachment bilaterally.  L5-S1: Disc degeneration with diffuse disc bulging. Extraforaminal disc protrusion/ osteophyte on the left displacing the left L5 nerve root. Left foraminal narrowing.  IMPRESSION: Mild spinal stenosis  L2-3. Small left-sided disc protrusion with subarticular stenosis on the left.  Moderate spinal stenosis L4-5. Small left-sided disc protrusion extending into the foramen. Subarticular foraminal encroachment is present bilaterally which could affect the L4 and L5 nerve roots bilaterally.  Left extraforaminal disc protrusion/osteophyte L5-S1.   He reports that he has been smoking cigarettes. He has been smoking about 0.50 packs per day. He has never used smokeless tobacco. No results for input(s): HGBA1C, LABURIC in the last 8760 hours.  Objective:  VS:  HT:5\' 6"  (167.6 cm)   WT:122 lb (55.3 kg)  BMI:19.7    BP:(!) 142/87  HR:83bpm  TEMP: ( )  RESP:100 % Physical Exam  Constitutional: He is oriented to person, place, and time. He appears well-developed and well-nourished. No distress.  HENT:  Head: Normocephalic and atraumatic.  Nose: Nose normal.  Mouth/Throat: Oropharynx is clear and moist.  Eyes: Pupils are equal, round, and reactive to light. Conjunctivae are normal.  Neck: Normal range of motion. Neck supple. No tracheal deviation present.  Cardiovascular: Regular rhythm and intact distal pulses.  Pulmonary/Chest: Effort normal and breath sounds normal.  Abdominal: Soft. He exhibits no distension. There is no rebound and no guarding.  Musculoskeletal: He exhibits no deformity.  Patient is slow to rise from a seated position  and does have concordant low back pain with extension of the lumbar spine.  He has pain with facet joint loading.  He is tender across the paraspinal region and the musculature with taut bands.  No pain over the greater trochanters.  No pain with hip rotation good distal strength.  No clonus.  Neurological: He is alert and oriented to person, place, and time. He exhibits normal muscle tone. Coordination normal.  Skin: Skin is warm. No rash noted.  Psychiatric: He has a normal mood and affect. His behavior is normal.  Nursing note and vitals reviewed.     Ortho Exam Imaging: No results found.  Past Medical/Family/Surgical/Social History: Medications & Allergies reviewed per EMR, new medications updated. Patient Active Problem List   Diagnosis Date Noted  . Pneumonia, tuberculous, with microscopic diagnosis 06/18/2017  . Pneumonia of both upper lobes due to infectious organism (HCC)   . Mycobacterial pneumonia (HCC) 06/17/2017  . Hyponatremia 06/17/2017  . Lobar pneumonia (HCC) 06/12/2017  . Protein-calorie malnutrition, severe (HCC) 06/12/2017  . Chest pain 09/25/2016   Past Medical History:  Diagnosis Date  . Hyperlipidemia   . Hypertension   . Pulmonary TB    Family History  Problem Relation Age of Onset  . Cancer Mother        Deseased   . Heart attack Father        Deseased   . Hypertension Sister   . CAD Brother        Deseased   . CAD Sister   . Cancer Brother        Deseased    Past Surgical History:  Procedure Laterality Date  . KNEE SURGERY    . SECONDARY CLOSURE ARM     Social History   Occupational History  . Not on file  Tobacco Use  . Smoking status: Current Every Day Smoker    Packs/day: 0.50    Types: Cigarettes  . Smokeless tobacco: Never Used  Substance and Sexual Activity  . Alcohol use: Yes    Comment: beer daily  . Drug use: No  . Sexual activity: Not Currently    Partners: Female

## 2018-05-24 ENCOUNTER — Ambulatory Visit (HOSPITAL_COMMUNITY)
Admission: RE | Admit: 2018-05-24 | Discharge: 2018-05-24 | Disposition: A | Discharge status: Home / Self Care | Payer: PPO | Source: Ambulatory Visit | Attending: Pulmonary Disease | Admitting: Pulmonary Disease

## 2018-05-24 ENCOUNTER — Other Ambulatory Visit (HOSPITAL_COMMUNITY): Payer: Self-pay | Admitting: Pulmonary Disease

## 2018-05-24 ENCOUNTER — Emergency Department (HOSPITAL_COMMUNITY)
Admission: EM | Admit: 2018-05-24 | Discharge: 2018-05-24 | Disposition: A | Discharge status: Home / Self Care | Payer: PPO | Attending: Emergency Medicine | Admitting: Emergency Medicine

## 2018-05-24 DIAGNOSIS — Z8611 Personal history of tuberculosis: Secondary | ICD-10-CM | POA: Insufficient documentation

## 2018-05-24 DIAGNOSIS — R05 Cough: Secondary | ICD-10-CM | POA: Diagnosis not present

## 2018-05-24 DIAGNOSIS — A150 Tuberculosis of lung: Secondary | ICD-10-CM

## 2018-05-24 DIAGNOSIS — I10 Essential (primary) hypertension: Secondary | ICD-10-CM | POA: Diagnosis not present

## 2018-05-24 DIAGNOSIS — J984 Other disorders of lung: Secondary | ICD-10-CM | POA: Diagnosis not present

## 2018-05-24 DIAGNOSIS — R9389 Abnormal findings on diagnostic imaging of other specified body structures: Secondary | ICD-10-CM | POA: Diagnosis not present

## 2018-05-24 DIAGNOSIS — J449 Chronic obstructive pulmonary disease, unspecified: Secondary | ICD-10-CM | POA: Diagnosis not present

## 2018-05-24 LAB — COMPREHENSIVE METABOLIC PANEL
Albumin: 4.6 (ref 3.5–5.0)
Calcium: 10 (ref 8.7–10.7)
GFR calc Af Amer: 85
GFR calc non Af Amer: 73

## 2018-05-24 LAB — BASIC METABOLIC PANEL
BUN: 8 (ref 4–21)
CO2: 25 — AB (ref 13–22)
Chloride: 102 (ref 99–108)
Creatinine: 1 (ref <=1.3)
Glucose: 44
Potassium: 4.4 (ref 3.4–5.3)
Sodium: 143 (ref 137–147)

## 2018-05-24 LAB — HEPATIC FUNCTION PANEL
ALT: 28 (ref 10–40)
AST: 44 — AB (ref 14–40)
Alkaline Phosphatase: 127 — AB (ref 25–125)

## 2018-08-18 DIAGNOSIS — I10 Essential (primary) hypertension: Secondary | ICD-10-CM | POA: Diagnosis not present

## 2018-08-18 DIAGNOSIS — Z8611 Personal history of tuberculosis: Secondary | ICD-10-CM | POA: Diagnosis not present

## 2018-08-18 DIAGNOSIS — F17200 Nicotine dependence, unspecified, uncomplicated: Secondary | ICD-10-CM | POA: Diagnosis not present

## 2018-08-18 DIAGNOSIS — J449 Chronic obstructive pulmonary disease, unspecified: Secondary | ICD-10-CM | POA: Diagnosis not present

## 2019-07-09 DIAGNOSIS — Z1211 Encounter for screening for malignant neoplasm of colon: Secondary | ICD-10-CM | POA: Diagnosis not present

## 2019-09-25 ENCOUNTER — Other Ambulatory Visit: Payer: Self-pay

## 2019-09-25 ENCOUNTER — Ambulatory Visit: Payer: PPO | Attending: Internal Medicine

## 2019-09-25 DIAGNOSIS — Z23 Encounter for immunization: Secondary | ICD-10-CM | POA: Insufficient documentation

## 2019-09-25 NOTE — Progress Notes (Signed)
   Covid-19 Vaccination Clinic  Name:  Scott Ferguson    MRN: [id number] DOB: Mar 05, 1951  09/25/2019  [name] was observed post Covid-19 immunization for 15 minutes without incidence. He was provided with Vaccine Information Sheet and instruction to access the V-Safe system.   [name] was instructed to call 911 with any severe reactions post vaccine: Marland Kitchen Difficulty breathing  . Swelling of your face and throat  . A fast heartbeat  . A bad rash all over your body  . Dizziness and weakness    Immunizations Administered    Name Date Dose VIS Date Route   Moderna COVID-19 Vaccine 09/25/2019  1:36 PM 0.5 mL 07/12/2019 Intramuscular   Manufacturer: Moderna   Lot: [id number]   NDC: 45848-350-75

## 2019-10-23 ENCOUNTER — Ambulatory Visit: Payer: PPO | Attending: Internal Medicine

## 2019-10-23 DIAGNOSIS — Z23 Encounter for immunization: Secondary | ICD-10-CM

## 2019-10-23 NOTE — Progress Notes (Signed)
   Covid-19 Vaccination Clinic  Name:  Dreyden Rohrman    MRN: [id number] DOB: Mar 29, 1951  10/23/2019  Mr. Kooi was observed post Covid-19 immunization for 15 minutes without incident. He was provided with Vaccine Information Sheet and instruction to access the V-Safe system.   Mr. Begue was instructed to call 911 with any severe reactions post vaccine: Marland Kitchen Difficulty breathing  . Swelling of face and throat  . A fast heartbeat  . A bad rash all over body  . Dizziness and weakness   Immunizations Administered    Name Date Dose VIS Date Route   Moderna COVID-19 Vaccine 10/23/2019 12:54 PM 0.5 mL 07/12/2019 Intramuscular   Manufacturer: Moderna   Lot: [id number]   NDC: 33582-518-98

## 2019-10-31 ENCOUNTER — Ambulatory Visit: Payer: PPO | Admitting: Family Medicine

## 2019-11-17 DIAGNOSIS — M130 Polyarthritis, unspecified: Secondary | ICD-10-CM | POA: Diagnosis not present

## 2019-11-17 DIAGNOSIS — R269 Unspecified abnormalities of gait and mobility: Secondary | ICD-10-CM | POA: Diagnosis not present

## 2019-11-17 DIAGNOSIS — Z0189 Encounter for other specified special examinations: Secondary | ICD-10-CM | POA: Diagnosis not present

## 2019-11-17 DIAGNOSIS — F17210 Nicotine dependence, cigarettes, uncomplicated: Secondary | ICD-10-CM | POA: Diagnosis not present

## 2019-11-24 ENCOUNTER — Encounter (INDEPENDENT_AMBULATORY_CARE_PROVIDER_SITE_OTHER): Payer: Self-pay | Admitting: *Deleted

## 2019-11-25 ENCOUNTER — Other Ambulatory Visit: Payer: Self-pay | Admitting: Internal Medicine

## 2019-11-25 ENCOUNTER — Other Ambulatory Visit (HOSPITAL_COMMUNITY): Payer: Self-pay | Admitting: Internal Medicine

## 2019-11-25 DIAGNOSIS — F17210 Nicotine dependence, cigarettes, uncomplicated: Secondary | ICD-10-CM

## 2019-11-30 DIAGNOSIS — R7301 Impaired fasting glucose: Secondary | ICD-10-CM | POA: Diagnosis not present

## 2019-11-30 DIAGNOSIS — Z0000 Encounter for general adult medical examination without abnormal findings: Secondary | ICD-10-CM | POA: Diagnosis not present

## 2019-12-06 DIAGNOSIS — R269 Unspecified abnormalities of gait and mobility: Secondary | ICD-10-CM | POA: Diagnosis not present

## 2019-12-06 DIAGNOSIS — E785 Hyperlipidemia, unspecified: Secondary | ICD-10-CM | POA: Diagnosis not present

## 2019-12-06 DIAGNOSIS — G4700 Insomnia, unspecified: Secondary | ICD-10-CM | POA: Diagnosis not present

## 2019-12-06 DIAGNOSIS — M130 Polyarthritis, unspecified: Secondary | ICD-10-CM | POA: Diagnosis not present

## 2019-12-06 DIAGNOSIS — F17210 Nicotine dependence, cigarettes, uncomplicated: Secondary | ICD-10-CM | POA: Diagnosis not present

## 2019-12-14 ENCOUNTER — Encounter (HOSPITAL_COMMUNITY): Payer: Self-pay

## 2019-12-14 ENCOUNTER — Other Ambulatory Visit: Payer: Self-pay

## 2019-12-14 ENCOUNTER — Ambulatory Visit (HOSPITAL_COMMUNITY)
Admission: RE | Admit: 2019-12-14 | Discharge: 2019-12-14 | Disposition: A | Discharge status: Home / Self Care | Payer: PPO | Source: Ambulatory Visit | Attending: Internal Medicine | Admitting: Internal Medicine

## 2019-12-14 DIAGNOSIS — F17210 Nicotine dependence, cigarettes, uncomplicated: Secondary | ICD-10-CM

## 2020-01-03 ENCOUNTER — Telehealth (INDEPENDENT_AMBULATORY_CARE_PROVIDER_SITE_OTHER): Payer: Self-pay | Admitting: *Deleted

## 2020-01-03 ENCOUNTER — Other Ambulatory Visit (INDEPENDENT_AMBULATORY_CARE_PROVIDER_SITE_OTHER): Payer: Self-pay | Admitting: *Deleted

## 2020-01-03 ENCOUNTER — Encounter (INDEPENDENT_AMBULATORY_CARE_PROVIDER_SITE_OTHER): Payer: Self-pay | Admitting: *Deleted

## 2020-01-03 MED ORDER — PLENVU 140 G PO SOLR: 1.0000 | Freq: Once | ORAL | 0 refills | Status: AC

## 2020-01-03 NOTE — Telephone Encounter (Signed)
Referring MD/PCP: hall   Procedure: tcs with propofol  Reason/Indication:  Screening, famhx colon ca  Has patient had this procedure before?  no  If so, when, by whom and where?    Is there a family history of colon cancer?  Yes, mother  Who?  What age when diagnosed?    Is patient diabetic?   no      Does patient have prosthetic heart valve or mechanical valve?  no  Do you have a pacemaker/defibrillator?  no  Has patient ever had endocarditis/atrial fibrillation? no  Does patient use oxygen? no  Has patient had joint replacement within last 12 months?  no  Is patient constipated or do they take laxatives? no  Does patient have a history of alcohol/drug use?  no  Is patient on blood thinner such as Coumadin, Plavix and/or Aspirin? no  Medications: trazadone 50 mg daily  Allergies: nkda  Medication Adjustment per Dr Karilyn Cota:   Procedure date & time: 02/03/20

## 2020-01-03 NOTE — Telephone Encounter (Signed)
Patient needs Plenvu (copay card) ° °

## 2020-01-05 ENCOUNTER — Other Ambulatory Visit: Payer: Self-pay

## 2020-01-05 ENCOUNTER — Ambulatory Visit (INDEPENDENT_AMBULATORY_CARE_PROVIDER_SITE_OTHER): Payer: Self-pay

## 2020-01-05 ENCOUNTER — Other Ambulatory Visit (INDEPENDENT_AMBULATORY_CARE_PROVIDER_SITE_OTHER): Payer: Self-pay | Admitting: *Deleted

## 2020-01-05 DIAGNOSIS — Z1211 Encounter for screening for malignant neoplasm of colon: Secondary | ICD-10-CM

## 2020-01-05 DIAGNOSIS — Z800 Family history of malignant neoplasm of digestive organs: Secondary | ICD-10-CM

## 2020-01-27 NOTE — Patient Instructions (Addendum)
Your procedure is scheduled on: 02/03/2020  Report to [facility] at   8:30  AM.  Call this number if you have problems the morning of surgery: [phone]   Remember:              Follow Directions on the letter you received from Your Physician's office regarding the Bowel Prep              No Smoking the day of Procedure :   Take these medicines the morning of surgery with A SIP OF WATER Use albuterol inhaler if needed.   Do not wear jewelry, make-up or nail polish.    Do not bring valuables to the hospital.  Contacts, dentures or bridgework may not be worn into surgery.  .   Patients discharged the day of surgery will not be allowed to drive home.     Colonoscopy, Adult, Care After This sheet gives you information about how to care for yourself after your procedure. Your health care provider may also give you more specific instructions. If you have problems or questions, contact your health care provider. What can I expect after the procedure? After the procedure, it is common to have:  A small amount of blood in your stool for 24 hours after the procedure.  Some gas.  Mild abdominal cramping or bloating.  Follow these instructions at home: General instructions   For the first 24 hours after the procedure: ? Do not drive or use machinery. ? Do not sign important documents. ? Do not drink alcohol. ? Do your regular daily activities at a slower pace than normal. ? Eat soft, easy-to-digest foods. ? Rest often.  Take over-the-counter or prescription medicines only as told by your health care provider.  It is up to you to get the results of your procedure. Ask your health care provider, or the department performing the procedure, when your results will be ready. Relieving cramping and bloating  Try walking around when you have cramps or feel bloated.  Apply heat to your abdomen as told by your health care provider. Use a heat source that your health care provider  recommends, such as a moist heat pack or a heating pad. ? Place a towel between your skin and the heat source. ? Leave the heat on for 20-30 minutes. ? Remove the heat if your skin turns bright red. This is especially important if you are unable to feel pain, heat, or cold. You may have a greater risk of getting burned. Eating and drinking  Drink enough fluid to keep your urine clear or pale yellow.  Resume your normal diet as instructed by your health care provider. Avoid heavy or fried foods that are hard to digest.  Avoid drinking alcohol for as long as instructed by your health care provider. Contact a health care provider if:  You have blood in your stool 2-3 days after the procedure. Get help right away if:  You have more than a small spotting of blood in your stool.  You pass large blood clots in your stool.  Your abdomen is swollen.  You have nausea or vomiting.  You have a fever.  You have increasing abdominal pain that is not relieved with medicine. This information is not intended to replace advice given to you by your health care provider. Make sure you discuss any questions you have with your health care provider. Document Released: 03/11/2004 Document Revised: 04/21/2016 Document Reviewed: 10/09/2015 Elsevier Interactive Patient Education  2018 [place].

## 2020-02-01 ENCOUNTER — Other Ambulatory Visit: Payer: Self-pay

## 2020-02-01 ENCOUNTER — Other Ambulatory Visit (HOSPITAL_COMMUNITY)
Admission: RE | Admit: 2020-02-01 | Discharge: 2020-02-01 | Disposition: A | Discharge status: Home / Self Care | Payer: PPO | Source: Ambulatory Visit | Attending: Internal Medicine | Admitting: Internal Medicine

## 2020-02-01 ENCOUNTER — Encounter (HOSPITAL_COMMUNITY)
Admission: RE | Admit: 2020-02-01 | Discharge: 2020-02-01 | Disposition: A | Discharge status: Home / Self Care | Payer: PPO | Source: Ambulatory Visit | Attending: Internal Medicine | Admitting: Internal Medicine

## 2020-02-01 ENCOUNTER — Encounter (HOSPITAL_COMMUNITY): Payer: Self-pay

## 2020-02-01 DIAGNOSIS — I498 Other specified cardiac arrhythmias: Secondary | ICD-10-CM | POA: Insufficient documentation

## 2020-02-01 DIAGNOSIS — Z1211 Encounter for screening for malignant neoplasm of colon: Secondary | ICD-10-CM

## 2020-02-01 DIAGNOSIS — Z01818 Encounter for other preprocedural examination: Secondary | ICD-10-CM | POA: Diagnosis not present

## 2020-02-01 DIAGNOSIS — Z20822 Contact with and (suspected) exposure to covid-19: Secondary | ICD-10-CM | POA: Insufficient documentation

## 2020-02-01 DIAGNOSIS — R9431 Abnormal electrocardiogram [ECG] [EKG]: Secondary | ICD-10-CM | POA: Insufficient documentation

## 2020-02-01 DIAGNOSIS — Z800 Family history of malignant neoplasm of digestive organs: Secondary | ICD-10-CM | POA: Diagnosis not present

## 2020-02-01 HISTORY — DX: Unspecified osteoarthritis, unspecified site: M19.90

## 2020-02-01 LAB — COMPREHENSIVE METABOLIC PANEL
ALT: 42 U/L (ref 0–44)
AST: 67 U/L — ABNORMAL HIGH (ref 15–41)
Albumin: 4.2 g/dL (ref 3.5–5.0)
Alkaline Phosphatase: 112 U/L (ref 38–126)
Anion gap: 15 (ref 5–15)
BUN: 17 mg/dL (ref 8–23)
CO2: 21 mmol/L — ABNORMAL LOW (ref 22–32)
Calcium: 9.1 mg/dL (ref 8.9–10.3)
Chloride: 98 mmol/L (ref 98–111)
Creatinine, Ser: 1.19 mg/dL (ref 0.61–1.24)
GFR calc Af Amer: >60 mL/min (ref >60)
GFR calc non Af Amer: >60 mL/min (ref >60)
Glucose, Bld: 201 mg/dL — ABNORMAL HIGH (ref 70–99)
Potassium: 4.3 mmol/L (ref 3.5–5.1)
Sodium: 134 mmol/L — ABNORMAL LOW (ref 135–145)
Total Bilirubin: 1.2 mg/dL (ref 0.3–1.2)
Total Protein: 7.6 g/dL (ref 6.5–8.1)

## 2020-02-02 LAB — SARS CORONAVIRUS 2 (TAT 6-24 HRS): SARS Coronavirus 2: NEGATIVE

## 2020-02-03 ENCOUNTER — Ambulatory Visit (HOSPITAL_COMMUNITY): Payer: PPO | Admitting: Anesthesiology

## 2020-02-03 ENCOUNTER — Other Ambulatory Visit: Payer: Self-pay

## 2020-02-03 ENCOUNTER — Ambulatory Visit (HOSPITAL_COMMUNITY)
Admission: RE | Admit: 2020-02-03 | Discharge: 2020-02-03 | Disposition: A | Discharge status: Home / Self Care | Payer: PPO | Attending: Internal Medicine | Admitting: Internal Medicine

## 2020-02-03 ENCOUNTER — Encounter (HOSPITAL_COMMUNITY)
Admission: RE | Disposition: A | Discharge status: Home / Self Care | Payer: Self-pay | Source: Home / Self Care | Attending: Internal Medicine

## 2020-02-03 ENCOUNTER — Encounter (HOSPITAL_COMMUNITY): Payer: Self-pay | Admitting: Internal Medicine

## 2020-02-03 DIAGNOSIS — Z8611 Personal history of tuberculosis: Secondary | ICD-10-CM | POA: Insufficient documentation

## 2020-02-03 DIAGNOSIS — F17210 Nicotine dependence, cigarettes, uncomplicated: Secondary | ICD-10-CM | POA: Diagnosis not present

## 2020-02-03 DIAGNOSIS — Z1211 Encounter for screening for malignant neoplasm of colon: Secondary | ICD-10-CM | POA: Diagnosis not present

## 2020-02-03 DIAGNOSIS — E785 Hyperlipidemia, unspecified: Secondary | ICD-10-CM | POA: Diagnosis not present

## 2020-02-03 DIAGNOSIS — J449 Chronic obstructive pulmonary disease, unspecified: Secondary | ICD-10-CM | POA: Insufficient documentation

## 2020-02-03 DIAGNOSIS — I10 Essential (primary) hypertension: Secondary | ICD-10-CM | POA: Diagnosis not present

## 2020-02-03 DIAGNOSIS — Z880 Allergy status to penicillin: Secondary | ICD-10-CM | POA: Diagnosis not present

## 2020-02-03 DIAGNOSIS — Z79899 Other long term (current) drug therapy: Secondary | ICD-10-CM | POA: Diagnosis not present

## 2020-02-03 DIAGNOSIS — Z800 Family history of malignant neoplasm of digestive organs: Secondary | ICD-10-CM

## 2020-02-03 DIAGNOSIS — R739 Hyperglycemia, unspecified: Secondary | ICD-10-CM

## 2020-02-03 DIAGNOSIS — Z8249 Family history of ischemic heart disease and other diseases of the circulatory system: Secondary | ICD-10-CM | POA: Diagnosis not present

## 2020-02-03 DIAGNOSIS — Z809 Family history of malignant neoplasm, unspecified: Secondary | ICD-10-CM | POA: Diagnosis not present

## 2020-02-03 DIAGNOSIS — K644 Residual hemorrhoidal skin tags: Secondary | ICD-10-CM | POA: Insufficient documentation

## 2020-02-03 DIAGNOSIS — M1990 Unspecified osteoarthritis, unspecified site: Secondary | ICD-10-CM | POA: Insufficient documentation

## 2020-02-03 DIAGNOSIS — K5730 Diverticulosis of large intestine without perforation or abscess without bleeding: Secondary | ICD-10-CM | POA: Diagnosis not present

## 2020-02-03 HISTORY — PX: COLONOSCOPY WITH PROPOFOL: SHX5780

## 2020-02-03 LAB — HEMOGLOBIN A1C
Hgb A1c MFr Bld: 5.7 % — ABNORMAL HIGH (ref 4.8–5.6)
Mean Plasma Glucose: 116.89 mg/dL

## 2020-02-03 SURGERY — COLONOSCOPY WITH PROPOFOL
Anesthesia: General

## 2020-02-03 MED ORDER — GLYCOPYRROLATE PF 0.2 MG/ML IJ SOSY
PREFILLED_SYRINGE | INTRAMUSCULAR | Status: AC
  Filled 2020-02-03: qty 1

## 2020-02-03 MED ORDER — CHLORHEXIDINE GLUCONATE CLOTH 2 % EX PADS: 6.0000 | MEDICATED_PAD | Freq: Once | CUTANEOUS | Status: DC

## 2020-02-03 MED ORDER — ESMOLOL HCL 100 MG/10ML IV SOLN
INTRAVENOUS | Status: AC
  Filled 2020-02-03: qty 10

## 2020-02-03 MED ORDER — ESMOLOL HCL 100 MG/10ML IV SOLN
INTRAVENOUS | Status: DC | PRN
  Administered 2020-02-03: 20 mg via INTRAVENOUS

## 2020-02-03 MED ORDER — LACTATED RINGERS IV SOLN
Freq: Once | INTRAVENOUS | Status: AC
  Administered 2020-02-03: 1000 mL via INTRAVENOUS

## 2020-02-03 MED ORDER — LACTATED RINGERS IV SOLN
INTRAVENOUS | Status: DC | PRN
Start: 2020-02-03 — End: 2020-02-03

## 2020-02-03 MED ORDER — GLYCOPYRROLATE 0.2 MG/ML IJ SOLN
INTRAMUSCULAR | Status: DC | PRN
  Administered 2020-02-03: .2 mg via INTRAVENOUS

## 2020-02-03 MED ORDER — PROPOFOL 500 MG/50ML IV EMUL
INTRAVENOUS | Status: DC | PRN
  Administered 2020-02-03: 125 ug/kg/min via INTRAVENOUS
  Administered 2020-02-03: 80 mg via INTRAVENOUS

## 2020-02-03 NOTE — Discharge Instructions (Signed)
Resume usual medications and diet as before. No driving for 24 hours. Next screening exam in 10 years.       Colonoscopy, Adult, Care After This sheet gives you information about how to care for yourself after your procedure. Your doctor may also give you more specific instructions. If you have problems or questions, call your doctor. What can I expect after the procedure? After the procedure, it is common to have:  A small amount of blood in your poop (stool) for 24 hours.  Some gas.  Mild cramping or bloating in your belly (abdomen). Follow these instructions at home: Eating and drinking   Drink enough fluid to keep your pee (urine) pale yellow.  Follow instructions from your doctor about what you cannot eat or drink.  Return to your normal diet as told by your doctor. Avoid heavy or fried foods that are hard to digest. Activity  Rest as told by your doctor.  Do not sit for a long time without moving. Get up to take short walks every 1-2 hours. This is important. Ask for help if you feel weak or unsteady.  Return to your normal activities as told by your doctor. Ask your doctor what activities are safe for you. To help cramping and bloating:   Try walking around.  Put heat on your belly as told by your doctor. Use the heat source that your doctor recommends, such as a moist heat pack or a heating pad. ? Put a towel between your skin and the heat source. ? Leave the heat on for 20-30 minutes. ? Remove the heat if your skin turns bright red. This is very important if you are unable to feel pain, heat, or cold. You may have a greater risk of getting burned. General instructions  For the first 24 hours after the procedure: ? Do not drive or use machinery. ? Do not sign important documents. ? Do not drink alcohol. ? Do your daily activities more slowly than normal. ? Eat foods that are soft and easy to digest.  Take over-the-counter or prescription medicines only as  told by your doctor.  Keep all follow-up visits as told by your doctor. This is important. Contact a doctor if:  You have blood in your poop 2-3 days after the procedure. Get help right away if:  You have more than a small amount of blood in your poop.  You see large clumps of tissue (blood clots) in your poop.  Your belly is swollen.  You feel like you may vomit (nauseous).  You vomit.  You have a fever.  You have belly pain that gets worse, and medicine does not help your pain. Summary  After the procedure, it is common to have a small amount of blood in your poop. You may also have mild cramping and bloating in your belly.  For the first 24 hours after the procedure, do not drive or use machinery, do not sign important documents, and do not drink alcohol.  Get help right away if you have a lot of blood in your poop, feel like you may vomit, have a fever, or have more belly pain. This information is not intended to replace advice given to you by your health care provider. Make sure you discuss any questions you have with your health care provider. Document Revised: 02/21/2019 Document Reviewed: 02/21/2019 Elsevier Patient Education  2020 [employer]     Diverticulosis  Diverticulosis is a condition that develops when small pouches (diverticula)  form in the wall of the large intestine (colon). The colon is where water is absorbed and stool (feces) is formed. The pouches form when the inside layer of the colon pushes through weak spots in the outer layers of the colon. You may have a few pouches or many of them. The pouches usually do not cause problems unless they become inflamed or infected. When this happens, the condition is called diverticulitis. What are the causes? The cause of this condition is not known. What increases the risk? The following factors may make you more likely to develop this condition:  Being older than age 70. Your risk for this condition  increases with age. Diverticulosis is rare among people younger than age 70. By age 70, many people have it.  Eating a low-fiber diet.  Having frequent constipation.  Being overweight.  Not getting enough exercise.  Smoking.  Taking over-the-counter pain medicines, like aspirin and ibuprofen.  Having a family history of diverticulosis. What are the signs or symptoms? In most people, there are no symptoms of this condition. If you do have symptoms, they may include:  Bloating.  Cramps in the abdomen.  Constipation or diarrhea.  Pain in the lower left side of the abdomen. How is this diagnosed? Because diverticulosis usually has no symptoms, it is most often diagnosed during an exam for other colon problems. The condition may be diagnosed by:  Using a flexible scope to examine the colon (colonoscopy).  Taking an X-ray of the colon after dye has been put into the colon (barium enema).  Having a CT scan. How is this treated? You may not need treatment for this condition. Your health care provider may recommend treatment to prevent problems. You may need treatment if you have symptoms or if you previously had diverticulitis. Treatment may include:  Eating a high-fiber diet.  Taking a fiber supplement.  Taking a live bacteria supplement (probiotic).  Taking medicine to relax your colon. Follow these instructions at home: Medicines  Take over-the-counter and prescription medicines only as told by your health care provider.  If told by your health care provider, take a fiber supplement or probiotic. Constipation prevention Your condition may cause constipation. To prevent or treat constipation, you may need to:  Drink enough fluid to keep your urine pale yellow.  Take over-the-counter or prescription medicines.  Eat foods that are high in fiber, such as beans, whole grains, and fresh fruits and vegetables.  Limit foods that are high in fat and processed sugars, such  as fried or sweet foods.  General instructions  Try not to strain when you have a bowel movement.  Keep all follow-up visits as told by your health care provider. This is important. Contact a health care provider if you:  Have pain in your abdomen.  Have bloating.  Have cramps.  Have not had a bowel movement in 3 days. Get help right away if:  Your pain gets worse.  Your bloating becomes very bad.  You have a fever or chills, and your symptoms suddenly get worse.  You vomit.  You have bowel movements that are bloody or black.  You have bleeding from your rectum. Summary  Diverticulosis is a condition that develops when small pouches (diverticula) form in the wall of the large intestine (colon).  You may have a few pouches or many of them.  This condition is most often diagnosed during an exam for other colon problems.  Treatment may include increasing the fiber in your diet,  taking supplements, or taking medicines. This information is not intended to replace advice given to you by your health care provider. Make sure you discuss any questions you have with your health care provider. Document Revised: 02/24/2019 Document Reviewed: 02/24/2019 Elsevier Patient Education  2020 [employer].    Hemorrhoids Hemorrhoids are swollen veins in and around the rectum or anus. There are two types of hemorrhoids:  Internal hemorrhoids. These occur in the veins that are just inside the rectum. They may poke through to the outside and become irritated and painful.  External hemorrhoids. These occur in the veins that are outside the anus and can be felt as a painful swelling or hard lump near the anus. Most hemorrhoids do not cause serious problems, and they can be managed with home treatments such as diet and lifestyle changes. If home treatments do not help the symptoms, procedures can be done to shrink or remove the hemorrhoids. What are the causes? This condition is caused by  increased pressure in the anal area. This pressure may result from various things, including:  Constipation.  Straining to have a bowel movement.  Diarrhea.  Pregnancy.  Obesity.  Sitting for long periods of time.  Heavy lifting or other activity that causes you to strain.  Anal sex.  Riding a bike for a long period of time. What are the signs or symptoms? Symptoms of this condition include:  Pain.  Anal itching or irritation.  Rectal bleeding.  Leakage of stool (feces).  Anal swelling.  One or more lumps around the anus. How is this diagnosed? This condition can often be diagnosed through a visual exam. Other exams or tests may also be done, such as:  An exam that involves feeling the rectal area with a gloved hand (digital rectal exam).  An exam of the anal canal that is done using a small tube (anoscope).  A blood test, if you have lost a significant amount of blood.  A test to look inside the colon using a flexible tube with a camera on the end (sigmoidoscopy or colonoscopy). How is this treated? This condition can usually be treated at home. However, various procedures may be done if dietary changes, lifestyle changes, and other home treatments do not help your symptoms. These procedures can help make the hemorrhoids smaller or remove them completely. Some of these procedures involve surgery, and others do not. Common procedures include:  Rubber band ligation. Rubber bands are placed at the base of the hemorrhoids to cut off their blood supply.  Sclerotherapy. Medicine is injected into the hemorrhoids to shrink them.  Infrared coagulation. A type of light energy is used to get rid of the hemorrhoids.  Hemorrhoidectomy surgery. The hemorrhoids are surgically removed, and the veins that supply them are tied off.  Stapled hemorrhoidopexy surgery. The surgeon staples the base of the hemorrhoid to the rectal wall. Follow these instructions at home: Eating and  drinking   Eat foods that have a lot of fiber in them, such as whole grains, beans, nuts, fruits, and vegetables.  Ask your health care provider about taking products that have added fiber (fiber supplements).  Reduce the amount of fat in your diet. You can do this by eating low-fat dairy products, eating less red meat, and avoiding processed foods.  Drink enough fluid to keep your urine pale yellow. Managing pain and swelling   Take warm sitz baths for 20 minutes, 3-4 times a day to ease pain and discomfort. You may do this  in a bathtub or using a portable sitz bath that fits over the toilet.  If directed, apply ice to the affected area. Using ice packs between sitz baths may be helpful. ? Put ice in a plastic bag. ? Place a towel between your skin and the bag. ? Leave the ice on for 20 minutes, 2-3 times a day. General instructions  Take over-the-counter and prescription medicines only as told by your health care provider.  Use medicated creams or suppositories as told.  Get regular exercise. Ask your health care provider how much and what kind of exercise is best for you. In general, you should do moderate exercise for at least 30 minutes on most days of the week (150 minutes each week). This can include activities such as walking, biking, or yoga.  Go to the bathroom when you have the urge to have a bowel movement. Do not wait.  Avoid straining to have bowel movements.  Keep the anal area dry and clean. Use wet toilet paper or moist towelettes after a bowel movement.  Do not sit on the toilet for long periods of time. This increases blood pooling and pain.  Keep all follow-up visits as told by your health care provider. This is important. Contact a health care provider if you have:  Increasing pain and swelling that are not controlled by treatment or medicine.  Difficulty having a bowel movement, or you are unable to have a bowel movement.  Pain or inflammation outside  the area of the hemorrhoids. Get help right away if you have:  Uncontrolled bleeding from your rectum. Summary  Hemorrhoids are swollen veins in and around the rectum or anus.  Most hemorrhoids can be managed with home treatments such as diet and lifestyle changes.  Taking warm sitz baths can help ease pain and discomfort.  In severe cases, procedures or surgery can be done to shrink or remove the hemorrhoids. This information is not intended to replace advice given to you by your health care provider. Make sure you discuss any questions you have with your health care provider. Document Revised: 12/24/2018 Document Reviewed: 12/17/2017 Elsevier Patient Education  2020 Elsevier Inc.     Monitored Anesthesia Care, Care After These instructions provide you with information about caring for yourself after your procedure. Your health care provider may also give you more specific instructions. Your treatment has been planned according to current medical practices, but problems sometimes occur. Call your health care provider if you have any problems or questions after your procedure. What can I expect after the procedure? After your procedure, you may:  Feel sleepy for several hours.  Feel clumsy and have poor balance for several hours.  Feel forgetful about what happened after the procedure.  Have poor judgment for several hours.  Feel nauseous or vomit.  Have a sore throat if you had a breathing tube during the procedure. Follow these instructions at home: For at least 24 hours after the procedure:      Have a responsible adult stay with you. It is important to have someone help care for you until you are awake and alert.  Rest as needed.  Do not: ? Participate in activities in which you could fall or become injured. ? Drive. ? Use heavy machinery. ? Drink alcohol. ? Take sleeping pills or medicines that cause drowsiness. ? Make important decisions or sign legal  documents. ? Take care of children on your own. Eating and drinking  Follow the diet that is  recommended by your health care provider.  If you vomit, drink water, juice, or soup when you can drink without vomiting.  Make sure you have little or no nausea before eating solid foods. General instructions  Take over-the-counter and prescription medicines only as told by your health care provider.  If you have sleep apnea, surgery and certain medicines can increase your risk for breathing problems. Follow instructions from your health care provider about wearing your sleep device: ? Anytime you are sleeping, including during daytime naps. ? While taking prescription pain medicines, sleeping medicines, or medicines that make you drowsy.  If you smoke, do not smoke without supervision.  Keep all follow-up visits as told by your health care provider. This is important. Contact a health care provider if:  You keep feeling nauseous or you keep vomiting.  You feel light-headed.  You develop a rash.  You have a fever. Get help right away if:  You have trouble breathing. Summary  For several hours after your procedure, you may feel sleepy and have poor judgment.  Have a responsible adult stay with you for at least 24 hours or until you are awake and alert. This information is not intended to replace advice given to you by your health care provider. Make sure you discuss any questions you have with your health care provider. Document Revised: 10/26/2017 Document Reviewed: 11/18/2015 Elsevier Patient Education  [place].

## 2020-02-03 NOTE — Transfer of Care (Signed)
Immediate Anesthesia Transfer of Care Note  Patient: Scott Ferguson  Procedure(s) Performed: COLONOSCOPY WITH PROPOFOL (N/A )  Patient Location: PACU  Anesthesia Type:MAC  Level of Consciousness: awake, alert , oriented and patient cooperative  Airway & Oxygen Therapy: Patient Spontanous Breathing  Post-op Assessment: Report given to RN, Post -op Vital signs reviewed and stable and Patient moving all extremities  Post vital signs: Reviewed  Last Vitals:  Vitals Value Taken Time  BP    Temp    Pulse 93 02/03/20 0942  Resp 25 02/03/20 0942  SpO2 100 % 02/03/20 0942  Vitals shown include unvalidated device data.  Last Pain:  Vitals:   02/03/20 0917  TempSrc:   PainSc: 0-No pain      Patients Stated Pain Goal: 8 (06/06/24 3664)  Complications: No complications documented.

## 2020-02-03 NOTE — Op Note (Signed)
[facility] Patient Name: Scott Ferguson Procedure Date: 02/03/2020 8:56 AM MRN: [id number] Date of Birth: 1951/01/23 Attending MD: Lionel December , MD CSN: [id number] Age: 69 Admit Type: Outpatient Procedure:                Colonoscopy Indications:              Screening for colorectal malignant neoplasm Providers:                Lionel December, MD, Crystal Page, Edrick Kins, RN Referring MD:             Catalina Pizza, MD Medicines:                Propofol per Anesthesia Complications:            No immediate complications. Estimated Blood Loss:     Estimated blood loss: none. Procedure:                Pre-Anesthesia Assessment:                           - Prior to the procedure, a History and Physical                            was performed, and patient medications and                            allergies were reviewed. The patient's tolerance of                            previous anesthesia was also reviewed. The risks                            and benefits of the procedure and the sedation                            options and risks were discussed with the patient.                            All questions were answered, and informed consent                            was obtained. Prior Anticoagulants: The patient has                            taken no previous anticoagulant or antiplatelet                            agents. ASA Grade Assessment: II - A patient with                            mild systemic disease. After reviewing the risks                            and benefits, the patient was deemed in  satisfactory condition to undergo the procedure.                           After obtaining informed consent, the colonoscope                            was passed under direct vision. Throughout the                            procedure, the patient's blood pressure, pulse, and                            oxygen saturations were monitored  continuously. The                            [id number]-[id number] ([id number]) scope was introduced through                            the anus and advanced to the the cecum, identified                            by appendiceal orifice and ileocecal valve. The                            colonoscopy was performed without difficulty. The                            patient tolerated the procedure well. The quality                            of the bowel preparation was adequate. The terminal                            ileum, ileocecal valve, appendiceal orifice, and                            rectum were photographed. Scope In: 9:22:01 AM Scope Out: 9:38:53 AM Scope Withdrawal Time: 0 hours 12 minutes 6 seconds  Total Procedure Duration: 0 hours 16 minutes 52 seconds  Findings:      The perianal and digital rectal examinations were normal.      A single diverticulum was found in the proximal ascending colon.      The exam was otherwise normal throughout the examined colon.      External hemorrhoids were found during retroflexion. The hemorrhoids       were small. Impression:               - Normal terminal ileum.                           - Diverticulosis in the proximal ascending colon.                           - External hemorrhoids.                           -  No specimens collected. Moderate Sedation:      Per Anesthesia Care Recommendation:           - Patient has a contact number available for                            emergencies. The signs and symptoms of potential                            delayed complications were discussed with the                            patient. Return to normal activities tomorrow.                            Written discharge instructions were provided to the                            patient.                           - Resume previous diet today.                           - Continue present medications.                           - Repeat colonoscopy in 10  years for screening                            purposes. Procedure Code(s):        --- Professional ---                           [phone], Colonoscopy, flexible; diagnostic, including                            collection of specimen(s) by brushing or washing,                            when performed (separate procedure) Diagnosis Code(s):        --- Professional ---                           Z12.11, Encounter for screening for malignant                            neoplasm of colon                           K64.4, Residual hemorrhoidal skin tags                           K57.30, Diverticulosis of large intestine without                            perforation or abscess without bleeding CPT copyright 2019 American Medical  Association. All rights reserved. The codes documented in this report are preliminary and upon coder review may  be revised to meet current compliance requirements. Lionel December, MD Lionel December, MD 02/03/2020 9:48:54 AM This report has been signed electronically. Number of Addenda: 0
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
## 2020-02-03 NOTE — Anesthesia Postprocedure Evaluation (Signed)
Anesthesia Post Note  Patient: Scott Ferguson  Procedure(s) Performed: COLONOSCOPY WITH PROPOFOL (N/A )  Patient location during evaluation: PACU Anesthesia Type: General Level of consciousness: awake, oriented, awake and alert and patient cooperative Pain management: pain level controlled Vital Signs Assessment: post-procedure vital signs reviewed and stable Respiratory status: spontaneous breathing, respiratory function stable and nonlabored ventilation Cardiovascular status: blood pressure returned to baseline and stable Postop Assessment: no headache Anesthetic complications: no   No complications documented.   Last Vitals:  Vitals:   02/03/20 0830  BP: (!) 147/96  Resp: 14  Temp: 36.6 C  SpO2: 94%    Last Pain:  Vitals:   02/03/20 0917  TempSrc:   PainSc: 0-No pain                 Brynda Peon

## 2020-02-03 NOTE — Anesthesia Preprocedure Evaluation (Addendum)
Anesthesia Evaluation  Patient identified by MRN, date of birth, ID band Patient awake    Reviewed: Allergy & Precautions, NPO status , Patient's Chart, lab work & pertinent test results  History of Anesthesia Complications Negative for: history of anesthetic complications  Airway Mallampati: II  TM Distance: >3 FB Neck ROM: Full    Dental  (+) Edentulous Upper, Edentulous Lower   Pulmonary pneumonia (TB - YEAR ago, resolved), resolved, COPD, Current Smoker and Patient abstained from smoking.,    Pulmonary exam normal breath sounds clear to auscultation       Cardiovascular Exercise Tolerance: Good hypertension, Pt. on medications Normal cardiovascular exam Rhythm:Regular Rate:Normal  01-Feb-2020 11:02:19 [facility] ROUTINE RECORD Sinus rhythm with marked sinus arrhythmia Anteroseptal infarct , age undetermined Abnormal ECG Left ventricular hypertrophy Confirmed by Carylon Perches [phone]) on 02/02/2020 10:27:33 AM   Neuro/Psych negative neurological ROS  negative psych ROS   GI/Hepatic negative GI ROS, (+)     substance abuse (last cocaine use 6 years ago)  alcohol use and cocaine use,   Endo/Other  negative endocrine ROS  Renal/GU negative Renal ROS  negative genitourinary   Musculoskeletal  (+) Arthritis ,   Abdominal   Peds  Hematology negative hematology ROS (+)   Anesthesia Other Findings   Reproductive/Obstetrics negative OB ROS                            Anesthesia Physical Anesthesia Plan  ASA: III  Anesthesia Plan: General   Post-op Pain Management:    Induction: Intravenous  PONV Risk Score and Plan: 1 and TIVA  Airway Management Planned: Nasal Cannula, Natural Airway and Simple Face Mask  Additional Equipment:   Intra-op Plan:   Post-operative Plan:   Informed Consent: I have reviewed the patients History and Physical, chart, labs and  discussed the procedure including the risks, benefits and alternatives for the proposed anesthesia with the patient or authorized representative who has indicated his/her understanding and acceptance.     Dental advisory given  Plan Discussed with: CRNA and Surgeon  Anesthesia Plan Comments:        Anesthesia Quick Evaluation

## 2020-02-07 ENCOUNTER — Encounter (HOSPITAL_COMMUNITY): Payer: Self-pay | Admitting: Internal Medicine

## 2020-03-09 DIAGNOSIS — E785 Hyperlipidemia, unspecified: Secondary | ICD-10-CM | POA: Diagnosis not present

## 2020-03-14 DIAGNOSIS — M130 Polyarthritis, unspecified: Secondary | ICD-10-CM | POA: Diagnosis not present

## 2020-03-14 DIAGNOSIS — F17210 Nicotine dependence, cigarettes, uncomplicated: Secondary | ICD-10-CM | POA: Diagnosis not present

## 2020-03-14 DIAGNOSIS — R202 Paresthesia of skin: Secondary | ICD-10-CM | POA: Diagnosis not present

## 2020-03-14 DIAGNOSIS — G4700 Insomnia, unspecified: Secondary | ICD-10-CM | POA: Diagnosis not present

## 2020-03-14 DIAGNOSIS — R269 Unspecified abnormalities of gait and mobility: Secondary | ICD-10-CM | POA: Diagnosis not present

## 2020-03-14 DIAGNOSIS — E785 Hyperlipidemia, unspecified: Secondary | ICD-10-CM | POA: Diagnosis not present

## 2020-03-14 DIAGNOSIS — R945 Abnormal results of liver function studies: Secondary | ICD-10-CM | POA: Diagnosis not present

## 2020-03-19 ENCOUNTER — Emergency Department (HOSPITAL_COMMUNITY): Payer: PPO

## 2020-03-19 ENCOUNTER — Encounter (HOSPITAL_COMMUNITY): Payer: Self-pay | Admitting: *Deleted

## 2020-03-19 ENCOUNTER — Other Ambulatory Visit: Payer: Self-pay

## 2020-03-19 ENCOUNTER — Emergency Department (HOSPITAL_COMMUNITY)
Admission: EM | Admit: 2020-03-19 | Discharge: 2020-03-19 | Disposition: A | Discharge status: Home / Self Care | Payer: PPO | Attending: Emergency Medicine | Admitting: Emergency Medicine

## 2020-03-19 DIAGNOSIS — M5410 Radiculopathy, site unspecified: Secondary | ICD-10-CM | POA: Diagnosis not present

## 2020-03-19 DIAGNOSIS — M19011 Primary osteoarthritis, right shoulder: Secondary | ICD-10-CM | POA: Diagnosis not present

## 2020-03-19 DIAGNOSIS — J449 Chronic obstructive pulmonary disease, unspecified: Secondary | ICD-10-CM | POA: Diagnosis not present

## 2020-03-19 DIAGNOSIS — R55 Syncope and collapse: Secondary | ICD-10-CM | POA: Diagnosis not present

## 2020-03-19 DIAGNOSIS — J8410 Pulmonary fibrosis, unspecified: Secondary | ICD-10-CM | POA: Diagnosis not present

## 2020-03-19 DIAGNOSIS — I10 Essential (primary) hypertension: Secondary | ICD-10-CM | POA: Diagnosis not present

## 2020-03-19 DIAGNOSIS — H538 Other visual disturbances: Secondary | ICD-10-CM | POA: Diagnosis not present

## 2020-03-19 DIAGNOSIS — J439 Emphysema, unspecified: Secondary | ICD-10-CM | POA: Diagnosis not present

## 2020-03-19 DIAGNOSIS — F17210 Nicotine dependence, cigarettes, uncomplicated: Secondary | ICD-10-CM | POA: Diagnosis not present

## 2020-03-19 DIAGNOSIS — M898X1 Other specified disorders of bone, shoulder: Secondary | ICD-10-CM | POA: Diagnosis not present

## 2020-03-19 DIAGNOSIS — J984 Other disorders of lung: Secondary | ICD-10-CM | POA: Diagnosis not present

## 2020-03-19 DIAGNOSIS — M792 Neuralgia and neuritis, unspecified: Secondary | ICD-10-CM

## 2020-03-19 DIAGNOSIS — R079 Chest pain, unspecified: Secondary | ICD-10-CM | POA: Diagnosis present

## 2020-03-19 DIAGNOSIS — M25511 Pain in right shoulder: Secondary | ICD-10-CM | POA: Diagnosis not present

## 2020-03-19 DIAGNOSIS — I6522 Occlusion and stenosis of left carotid artery: Secondary | ICD-10-CM | POA: Diagnosis not present

## 2020-03-19 DIAGNOSIS — M79601 Pain in right arm: Secondary | ICD-10-CM | POA: Diagnosis not present

## 2020-03-19 DIAGNOSIS — S4991XA Unspecified injury of right shoulder and upper arm, initial encounter: Secondary | ICD-10-CM | POA: Diagnosis not present

## 2020-03-19 DIAGNOSIS — S199XXA Unspecified injury of neck, initial encounter: Secondary | ICD-10-CM | POA: Diagnosis not present

## 2020-03-19 LAB — BASIC METABOLIC PANEL
Anion gap: 10 (ref 5–15)
BUN: 6 mg/dL — ABNORMAL LOW (ref 8–23)
CO2: 22 mmol/L (ref 22–32)
Calcium: 9 mg/dL (ref 8.9–10.3)
Chloride: 101 mmol/L (ref 98–111)
Creatinine, Ser: 0.89 mg/dL (ref 0.61–1.24)
GFR calc Af Amer: >60 mL/min (ref >60)
GFR calc non Af Amer: >60 mL/min (ref >60)
Glucose, Bld: 130 mg/dL — ABNORMAL HIGH (ref 70–99)
Potassium: 3.8 mmol/L (ref 3.5–5.1)
Sodium: 133 mmol/L — ABNORMAL LOW (ref 135–145)

## 2020-03-19 LAB — CBC
HCT: 45.3 % (ref 39.0–52.0)
Hemoglobin: 14.6 g/dL (ref 13.0–17.0)
MCH: 31.7 pg (ref 26.0–34.0)
MCHC: 32.2 g/dL (ref 30.0–36.0)
MCV: 98.3 fL (ref 80.0–100.0)
Platelets: 282 10*3/uL (ref 150–400)
RBC: 4.61 MIL/uL (ref 4.22–5.81)
RDW: 13.6 % (ref 11.5–15.5)
WBC: 8.7 10*3/uL (ref 4.0–10.5)
nRBC: 0 % (ref 0.0–0.2)

## 2020-03-19 LAB — TROPONIN I (HIGH SENSITIVITY)
Troponin I (High Sensitivity): 3 ng/L (ref <18)
Troponin I (High Sensitivity): 3 ng/L (ref <18)

## 2020-03-19 MED ORDER — DICLOFENAC SODIUM 1 % EX GEL: 2.0000 g | Freq: Four times a day (QID) | CUTANEOUS | 0 refills | Status: AC | PRN

## 2020-03-19 MED ORDER — SODIUM CHLORIDE 0.9% FLUSH: 3.0000 mL | Freq: Once | INTRAVENOUS | Status: DC

## 2020-03-19 MED ORDER — SODIUM CHLORIDE 0.9 % IV BOLUS
500.0000 mL | Freq: Once | INTRAVENOUS | Status: AC
  Administered 2020-03-19: 500 mL via INTRAVENOUS

## 2020-03-19 NOTE — ED Triage Notes (Signed)
Pt c/o chest pain and right shoulder pain; pt states he almost passed out today and since the episode occurred he has noticed his vision is getting blurry in both eyes
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
## 2020-03-19 NOTE — Discharge Instructions (Signed)
You were seen in the emergency room today with lightheadedness.  I like for you to follow with the cardiology team and I placed a referral in our system.  I have listed their contact information in the discharge paperwork.  Please call tomorrow to schedule your appointment.   I have also listed the name of an orthopedic surgeon locally.  Please call to schedule a follow-up appointment regarding your shoulder pain. This may require MRI at some point.   Return to the ED with any new or worsening symptoms.

## 2020-03-19 NOTE — ED Notes (Signed)
Patient transported to X-ray 

## 2020-06-19 DIAGNOSIS — R7303 Prediabetes: Secondary | ICD-10-CM | POA: Diagnosis not present

## 2020-06-19 DIAGNOSIS — I10 Essential (primary) hypertension: Secondary | ICD-10-CM | POA: Diagnosis not present

## 2020-06-21 DIAGNOSIS — G4700 Insomnia, unspecified: Secondary | ICD-10-CM | POA: Diagnosis not present

## 2020-06-21 DIAGNOSIS — R945 Abnormal results of liver function studies: Secondary | ICD-10-CM | POA: Diagnosis not present

## 2020-06-21 DIAGNOSIS — R269 Unspecified abnormalities of gait and mobility: Secondary | ICD-10-CM | POA: Diagnosis not present

## 2020-06-21 DIAGNOSIS — M130 Polyarthritis, unspecified: Secondary | ICD-10-CM | POA: Diagnosis not present

## 2020-06-21 DIAGNOSIS — R7301 Impaired fasting glucose: Secondary | ICD-10-CM | POA: Diagnosis not present

## 2020-06-21 DIAGNOSIS — R202 Paresthesia of skin: Secondary | ICD-10-CM | POA: Diagnosis not present

## 2020-06-21 DIAGNOSIS — E785 Hyperlipidemia, unspecified: Secondary | ICD-10-CM | POA: Diagnosis not present

## 2020-06-21 DIAGNOSIS — F17210 Nicotine dependence, cigarettes, uncomplicated: Secondary | ICD-10-CM | POA: Diagnosis not present

## 2020-12-24 DIAGNOSIS — Z125 Encounter for screening for malignant neoplasm of prostate: Secondary | ICD-10-CM | POA: Diagnosis not present

## 2020-12-24 DIAGNOSIS — E785 Hyperlipidemia, unspecified: Secondary | ICD-10-CM | POA: Diagnosis not present

## 2020-12-24 DIAGNOSIS — R7301 Impaired fasting glucose: Secondary | ICD-10-CM | POA: Diagnosis not present

## 2020-12-27 DIAGNOSIS — Z125 Encounter for screening for malignant neoplasm of prostate: Secondary | ICD-10-CM | POA: Diagnosis not present

## 2020-12-27 DIAGNOSIS — R7301 Impaired fasting glucose: Secondary | ICD-10-CM | POA: Diagnosis not present

## 2020-12-27 DIAGNOSIS — E785 Hyperlipidemia, unspecified: Secondary | ICD-10-CM | POA: Diagnosis not present

## 2020-12-27 DIAGNOSIS — F5109 Other insomnia not due to a substance or known physiological condition: Secondary | ICD-10-CM | POA: Diagnosis not present

## 2020-12-27 DIAGNOSIS — F17200 Nicotine dependence, unspecified, uncomplicated: Secondary | ICD-10-CM | POA: Diagnosis not present

## 2020-12-27 DIAGNOSIS — M130 Polyarthritis, unspecified: Secondary | ICD-10-CM | POA: Diagnosis not present

## 2021-04-06 IMAGING — DX DG CHEST 2V
2 series · 2 of 2 positions shown · non-contrast
Comparison: PA and lateral chest 05/24/2018.

CLINICAL DATA: Chest pain, blurry vision and near syncope today.
History of tuberculosis.

EXAM:
CHEST - 2 VIEW

[chest pa]
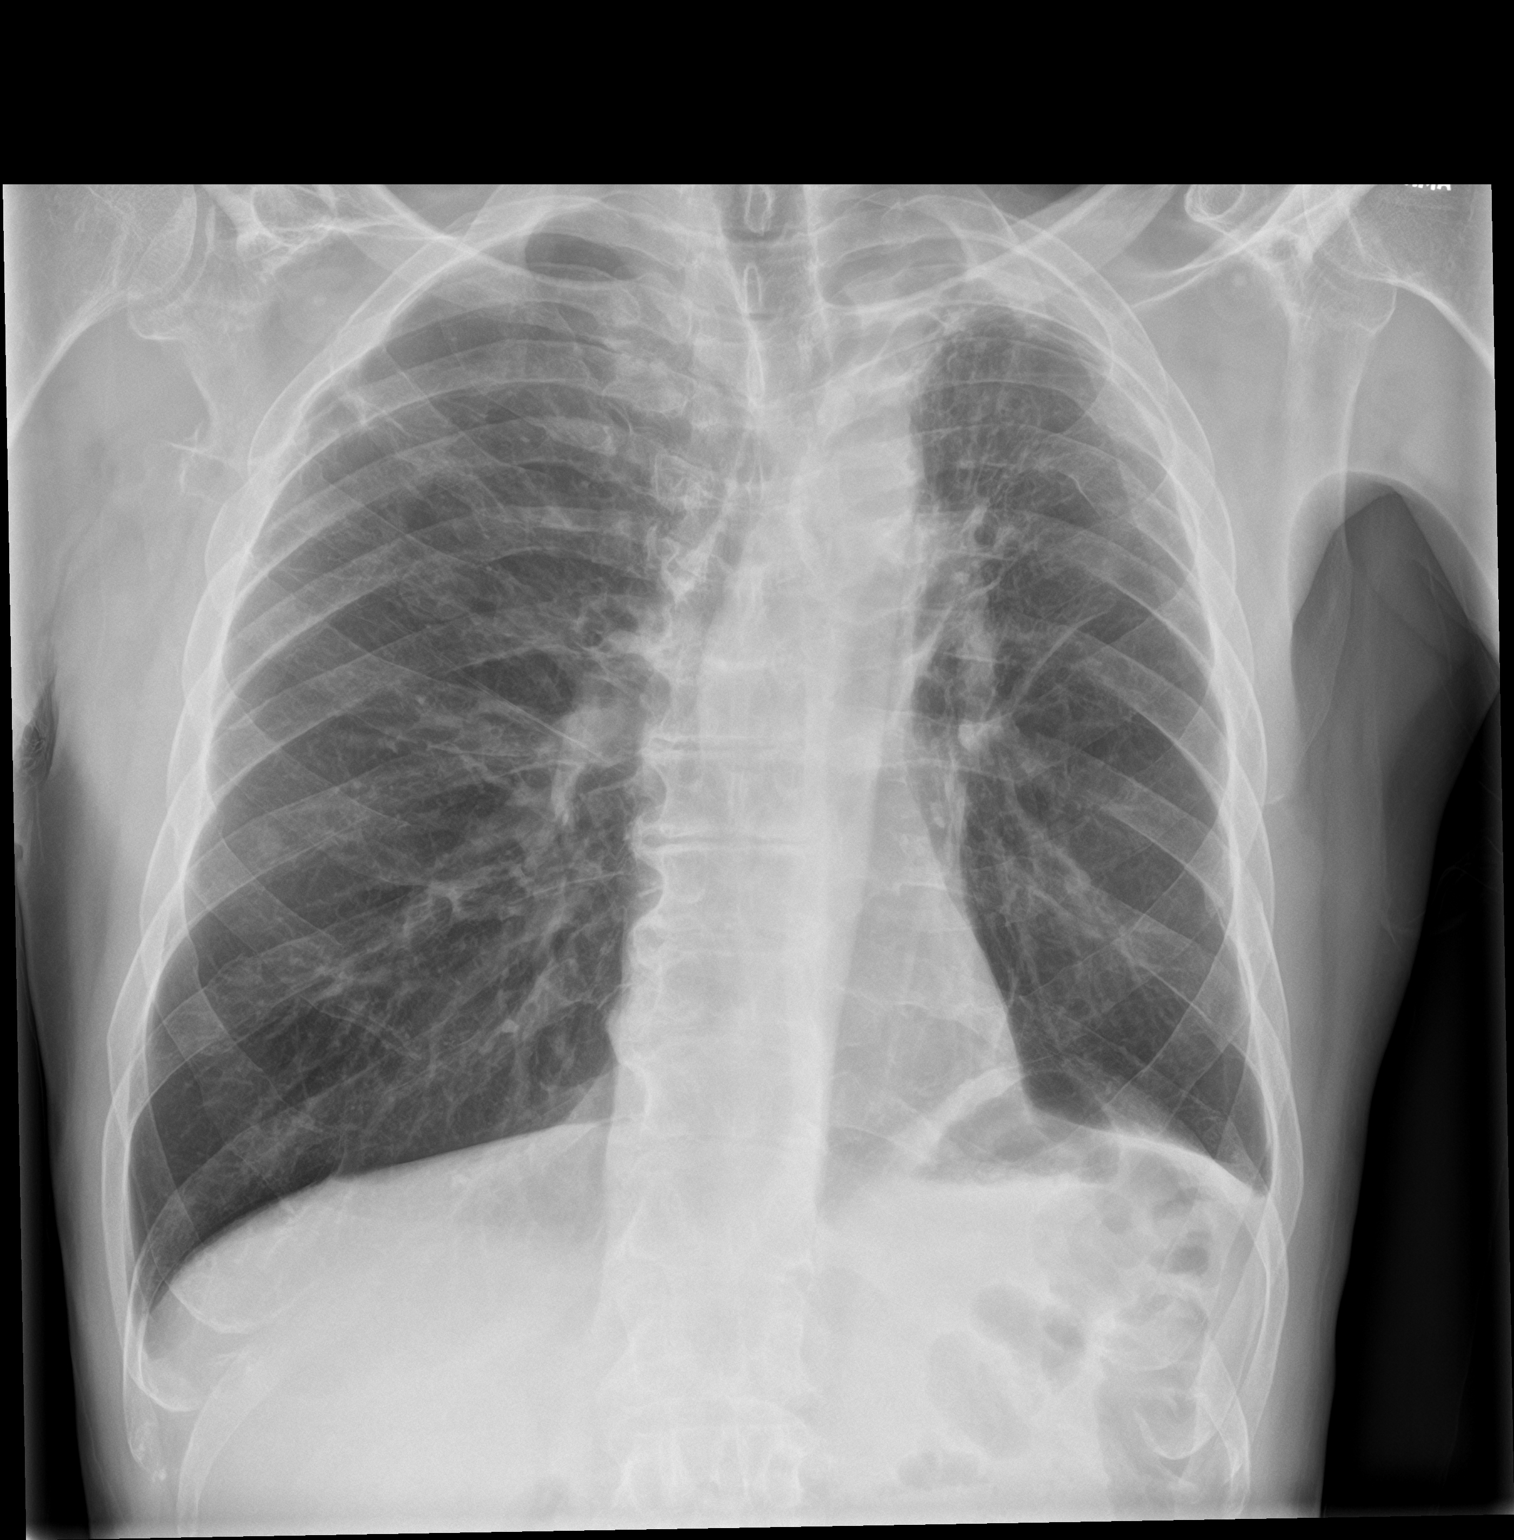

[chest lat]
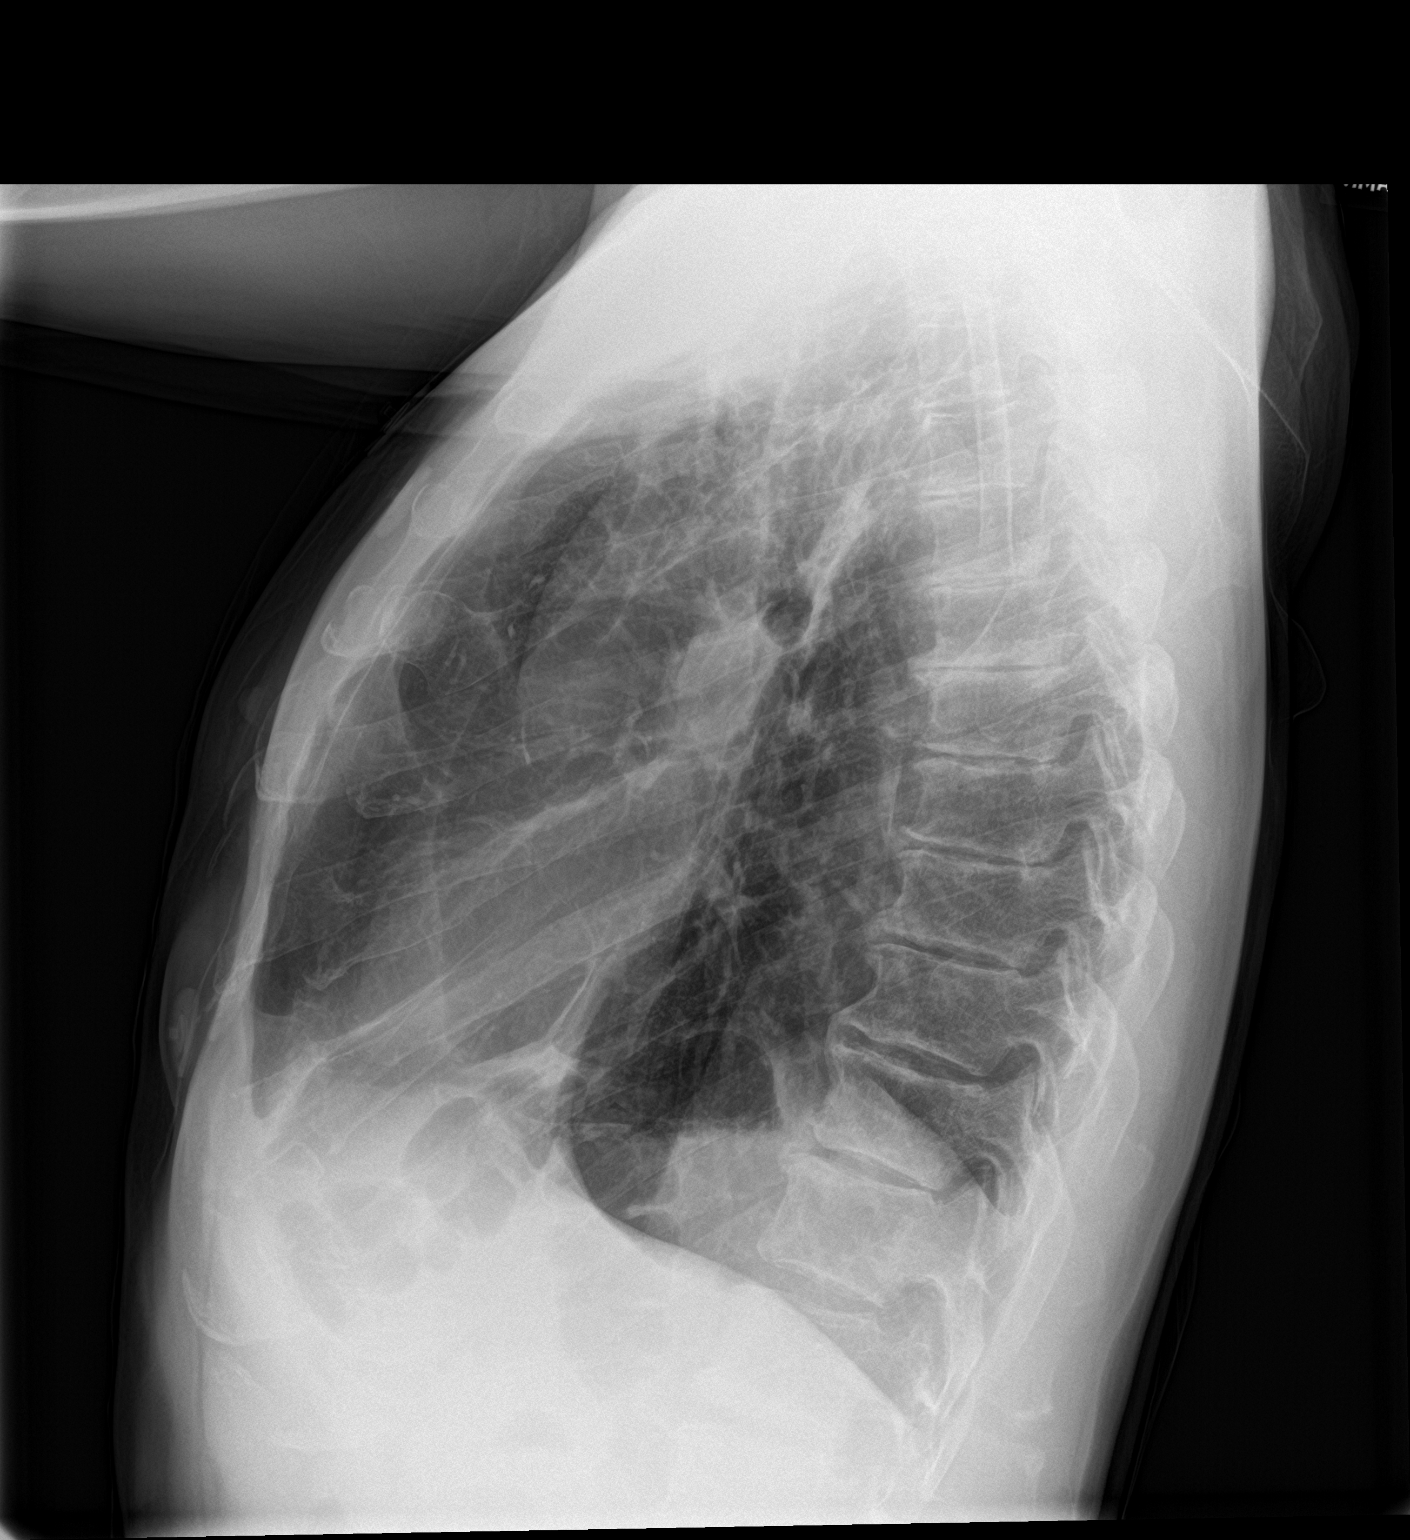

[2 of 2 positions shown; findings below may reference images not displayed]

FINDINGS: The lungs are emphysematous. Volume loss and scarring in the left
chest are again seen. Heart size is normal. No acute bony
abnormality. Remote left rib fractures again seen.
IMPRESSION: No acute disease.

Aortic Atherosclerosis (0X9IO-KII.I) and Emphysema (0X9IO-5A1.0).

## 2021-04-06 IMAGING — CT CT CERVICAL SPINE W/O CM
3 of 4 series · 13 of 33 positions shown, 16 images · non-contrast
Comparison: January 09, 2016 cervical spine CT; chest radiograph [place]

CLINICAL DATA: Pain following fall

EXAM:
CT CERVICAL SPINE WITHOUT CONTRAST
TECHNIQUE: Multidetector CT imaging of the cervical spine was performed without
intravenous contrast. Multiplanar CT image reconstructions were also
generated.

[Series 5: cor bone · coronal · 0.28mm/px · 3 of 61 slices shown]
[im 13/61  bone]
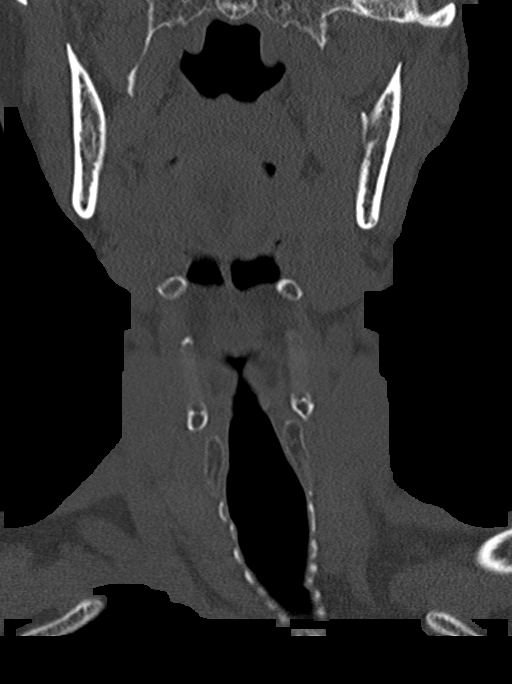
[im 25/61  bone]
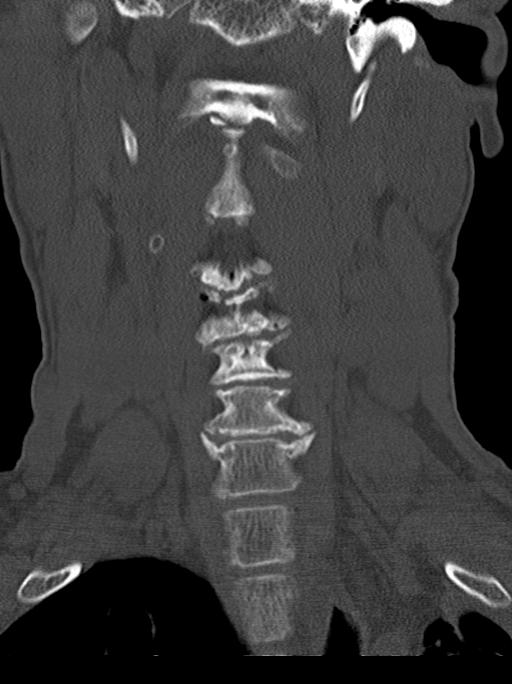
[im 37/61  bone]
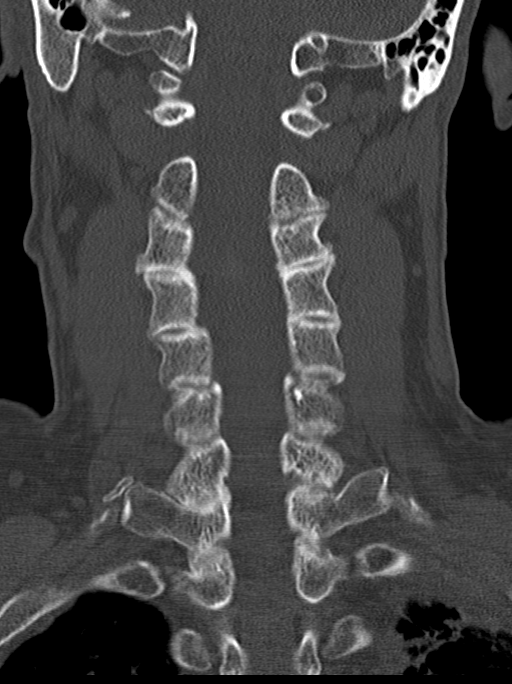

[Series 6: sag bone · sagittal · 0.32mm/px · 5 of 85 slices shown, 6 images]
[im 29/85  bone]
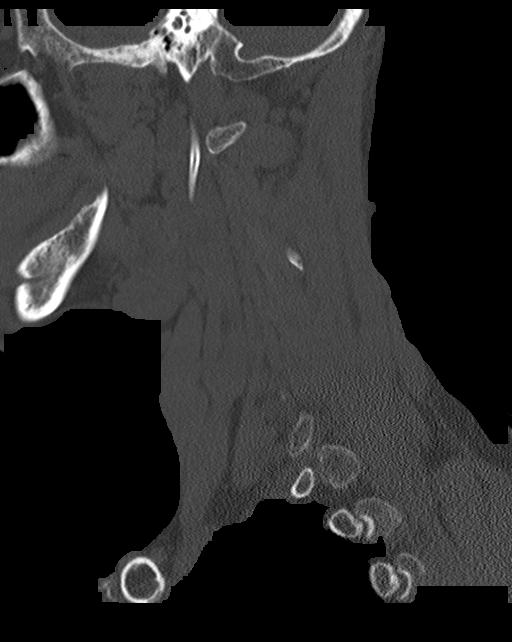
[im 36/85  bone]
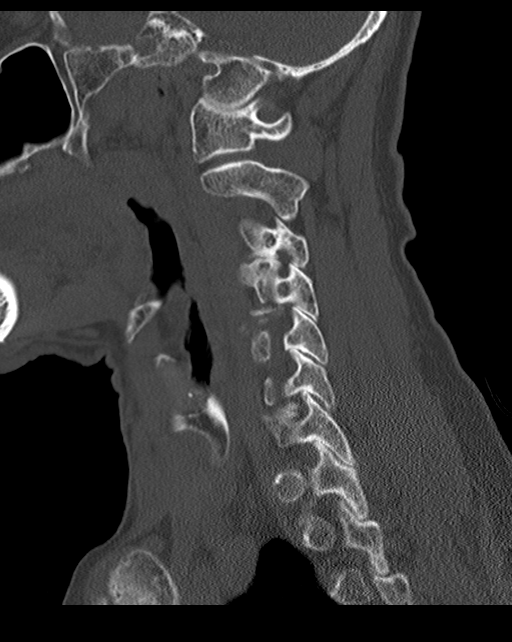
[im 43/85  soft-tissue]
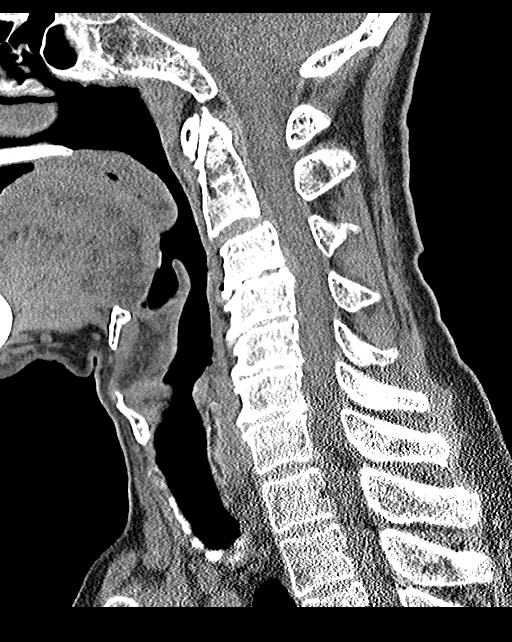
[im 43/85  bone]
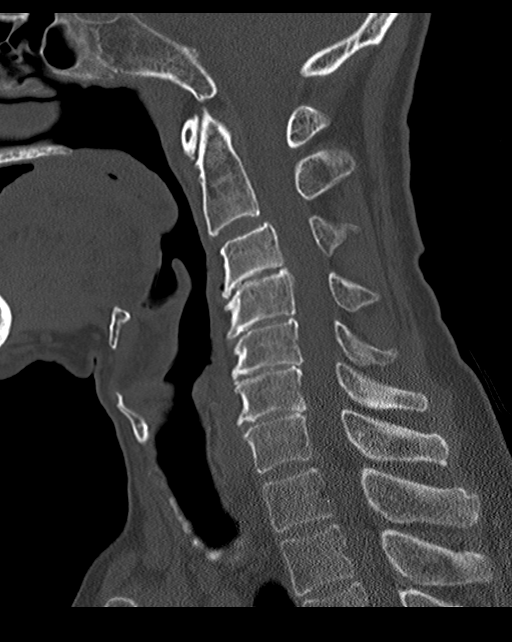
[im 50/85  bone]
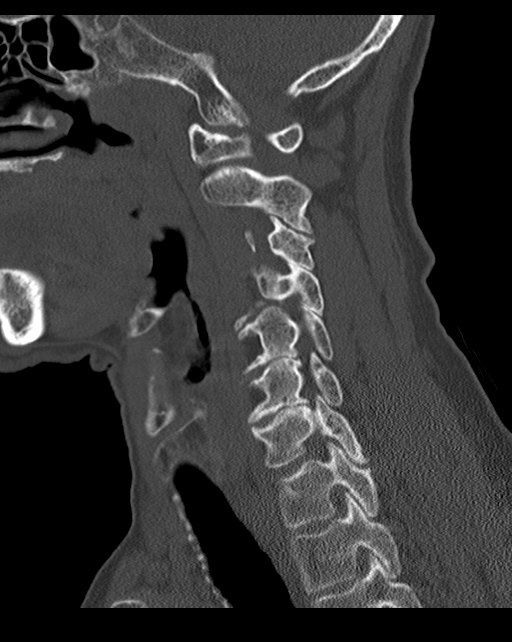
[im 57/85  bone]
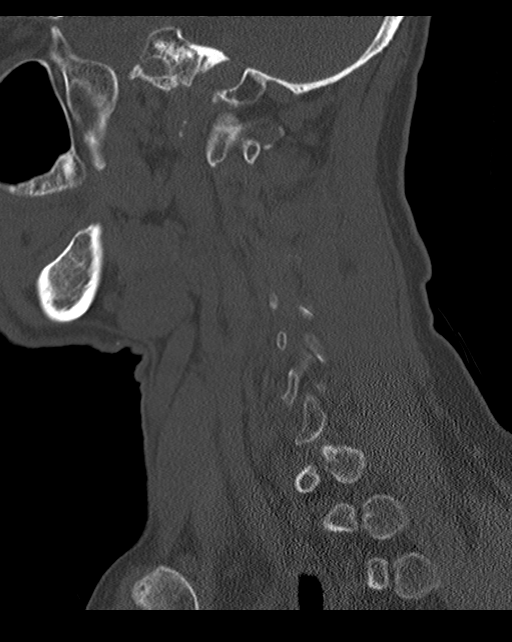

[Series 7: orthogonal axials · axial · 0.21mm/px · z∈[-181,-64]mm · 5 of 89 slices shown, 7 images]
[im 15/89  soft-tissue]
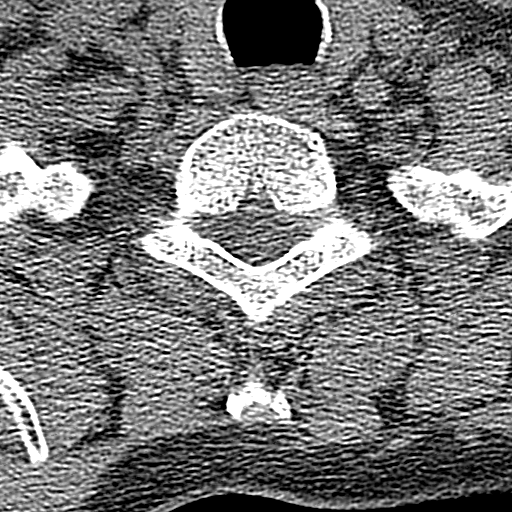
[im 15/89  bone]
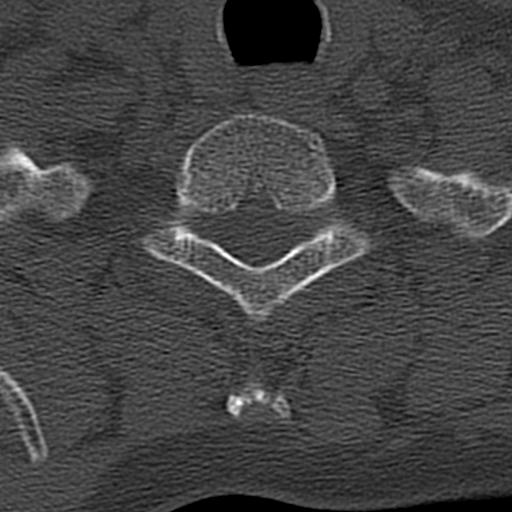
[im 30/89  bone]
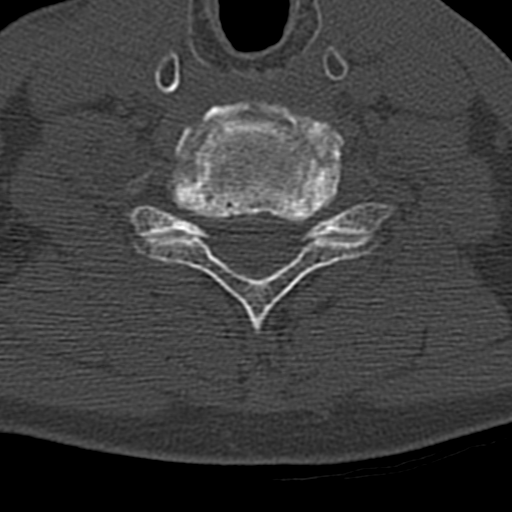
[im 45/89  bone]
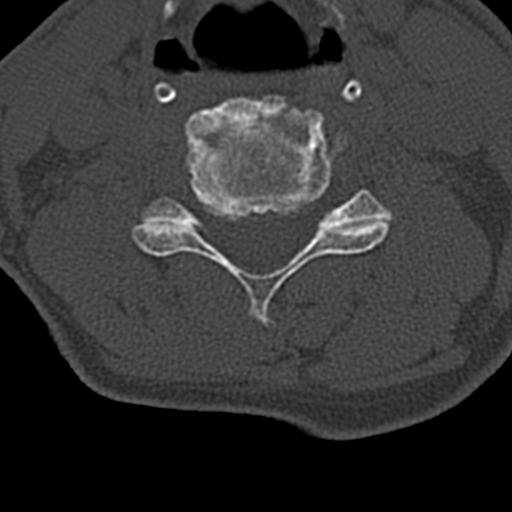
[im 59/89  bone]
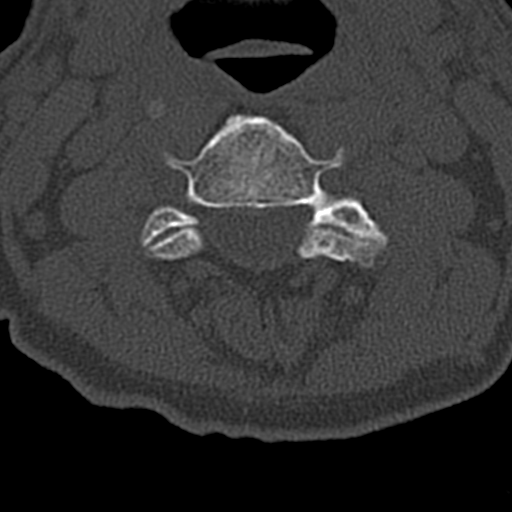
[im 74/89  soft-tissue]
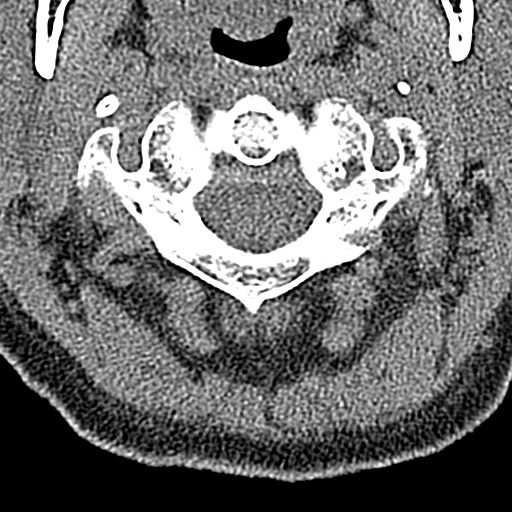
[im 74/89  bone]
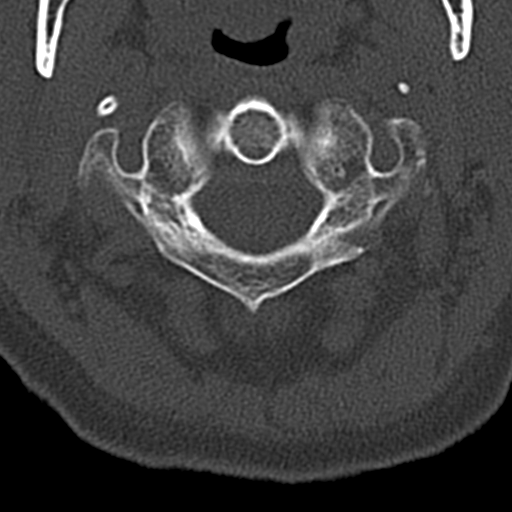

[13 of 33 positions shown; findings below may reference images not displayed]

FINDINGS: Alignment: There is no appreciable spondylolisthesis.

Skull base and vertebrae: Skull base and craniocervical junction
regions appear normal. There is no demonstrable fracture. There are
no blastic or lytic bone lesions.

Soft tissues and spinal canal: Prevertebral soft tissues and
predental space regions are normal. There is no cord or canal
hematoma. No paraspinous lesions are evident.

Disc levels: There is moderately severe disc space narrowing at
C3-4, C4-5, C5-6, and C6-7. There are anterior osteophytes at C3,
C4, C5, and C6. There is facet hypertrophy with exit foraminal
narrowing at C3-4 bilaterally, at C4-5 on the right, at C5-6
bilaterally, and at C6-7 bilaterally with impression on exiting
nerve roots due to bony hypertrophy. Lesser degrees of facet
hypertrophy noted elsewhere. There is no frank disc extrusion or
high-grade stenosis. Uncovertebral spurring is noted C4, C5, C6, and
C7 bilaterally.

Upper chest: There is emphysematous change in the upper lobes with
extensive bullous disease in the left upper lobe, findings that have
progressed since the 4196 study.

Other: There is calcification in the left carotid artery.
IMPRESSION: 1.  No fracture or spondylolisthesis.

2. Multilevel arthropathy with exit foraminal narrowing at multiple
levels bilaterally and impression on multiple exiting nerve roots
bilaterally. No frank disc extrusion or high-grade stenosis.

3. Upper lobe bullous disease. Apparent fibrosis and scarring left
upper lobe, incompletely visualized.

4.  Left carotid artery calcification.

## 2021-06-28 DIAGNOSIS — R7301 Impaired fasting glucose: Secondary | ICD-10-CM | POA: Diagnosis not present

## 2021-06-28 DIAGNOSIS — Z125 Encounter for screening for malignant neoplasm of prostate: Secondary | ICD-10-CM | POA: Diagnosis not present

## 2021-06-28 DIAGNOSIS — E785 Hyperlipidemia, unspecified: Secondary | ICD-10-CM | POA: Diagnosis not present

## 2021-07-01 DIAGNOSIS — E538 Deficiency of other specified B group vitamins: Secondary | ICD-10-CM | POA: Diagnosis not present

## 2021-07-01 DIAGNOSIS — F5109 Other insomnia not due to a substance or known physiological condition: Secondary | ICD-10-CM | POA: Diagnosis not present

## 2021-07-01 DIAGNOSIS — E785 Hyperlipidemia, unspecified: Secondary | ICD-10-CM | POA: Diagnosis not present

## 2021-07-01 DIAGNOSIS — Z23 Encounter for immunization: Secondary | ICD-10-CM | POA: Diagnosis not present

## 2021-07-01 DIAGNOSIS — Z9114 Patient's other noncompliance with medication regimen: Secondary | ICD-10-CM | POA: Diagnosis not present

## 2021-07-01 DIAGNOSIS — M130 Polyarthritis, unspecified: Secondary | ICD-10-CM | POA: Diagnosis not present

## 2021-07-01 DIAGNOSIS — R7301 Impaired fasting glucose: Secondary | ICD-10-CM | POA: Diagnosis not present

## 2021-07-01 DIAGNOSIS — Z125 Encounter for screening for malignant neoplasm of prostate: Secondary | ICD-10-CM | POA: Diagnosis not present

## 2021-07-01 DIAGNOSIS — F17200 Nicotine dependence, unspecified, uncomplicated: Secondary | ICD-10-CM | POA: Diagnosis not present

## 2022-07-01 DIAGNOSIS — E538 Deficiency of other specified B group vitamins: Secondary | ICD-10-CM | POA: Diagnosis not present

## 2022-07-01 DIAGNOSIS — E785 Hyperlipidemia, unspecified: Secondary | ICD-10-CM | POA: Diagnosis not present

## 2022-07-01 DIAGNOSIS — R7301 Impaired fasting glucose: Secondary | ICD-10-CM | POA: Diagnosis not present

## 2022-07-08 DIAGNOSIS — F17200 Nicotine dependence, unspecified, uncomplicated: Secondary | ICD-10-CM | POA: Diagnosis not present

## 2022-07-08 DIAGNOSIS — E785 Hyperlipidemia, unspecified: Secondary | ICD-10-CM | POA: Diagnosis not present

## 2022-07-08 DIAGNOSIS — I700 Atherosclerosis of aorta: Secondary | ICD-10-CM | POA: Diagnosis not present

## 2022-07-08 DIAGNOSIS — J439 Emphysema, unspecified: Secondary | ICD-10-CM | POA: Diagnosis not present

## 2022-07-08 DIAGNOSIS — M130 Polyarthritis, unspecified: Secondary | ICD-10-CM | POA: Diagnosis not present

## 2022-07-08 DIAGNOSIS — Z91148 Patient's other noncompliance with medication regimen for other reason: Secondary | ICD-10-CM | POA: Diagnosis not present

## 2022-07-08 DIAGNOSIS — R7303 Prediabetes: Secondary | ICD-10-CM | POA: Diagnosis not present

## 2022-07-08 DIAGNOSIS — F5109 Other insomnia not due to a substance or known physiological condition: Secondary | ICD-10-CM | POA: Diagnosis not present

## 2022-07-08 DIAGNOSIS — R7301 Impaired fasting glucose: Secondary | ICD-10-CM | POA: Diagnosis not present

## 2022-07-08 DIAGNOSIS — E538 Deficiency of other specified B group vitamins: Secondary | ICD-10-CM | POA: Diagnosis not present

## 2022-12-30 DIAGNOSIS — E785 Hyperlipidemia, unspecified: Secondary | ICD-10-CM | POA: Diagnosis not present

## 2022-12-30 DIAGNOSIS — R7303 Prediabetes: Secondary | ICD-10-CM | POA: Diagnosis not present

## 2022-12-30 DIAGNOSIS — R7301 Impaired fasting glucose: Secondary | ICD-10-CM | POA: Diagnosis not present

## 2023-01-06 DIAGNOSIS — F17200 Nicotine dependence, unspecified, uncomplicated: Secondary | ICD-10-CM | POA: Diagnosis not present

## 2023-01-06 DIAGNOSIS — E538 Deficiency of other specified B group vitamins: Secondary | ICD-10-CM | POA: Diagnosis not present

## 2023-01-06 DIAGNOSIS — Z0000 Encounter for general adult medical examination without abnormal findings: Secondary | ICD-10-CM | POA: Diagnosis not present

## 2023-01-06 DIAGNOSIS — R7301 Impaired fasting glucose: Secondary | ICD-10-CM | POA: Diagnosis not present

## 2023-01-06 DIAGNOSIS — R7303 Prediabetes: Secondary | ICD-10-CM | POA: Diagnosis not present

## 2023-01-06 DIAGNOSIS — E785 Hyperlipidemia, unspecified: Secondary | ICD-10-CM | POA: Diagnosis not present

## 2023-01-06 DIAGNOSIS — I700 Atherosclerosis of aorta: Secondary | ICD-10-CM | POA: Diagnosis not present

## 2023-01-06 DIAGNOSIS — M130 Polyarthritis, unspecified: Secondary | ICD-10-CM | POA: Diagnosis not present

## 2023-01-06 DIAGNOSIS — J439 Emphysema, unspecified: Secondary | ICD-10-CM | POA: Diagnosis not present

## 2023-07-15 DIAGNOSIS — R7301 Impaired fasting glucose: Secondary | ICD-10-CM | POA: Diagnosis not present

## 2023-07-15 DIAGNOSIS — R7303 Prediabetes: Secondary | ICD-10-CM | POA: Diagnosis not present

## 2023-07-15 DIAGNOSIS — E785 Hyperlipidemia, unspecified: Secondary | ICD-10-CM | POA: Diagnosis not present

## 2023-07-20 DIAGNOSIS — E538 Deficiency of other specified B group vitamins: Secondary | ICD-10-CM | POA: Diagnosis not present

## 2023-07-20 DIAGNOSIS — E785 Hyperlipidemia, unspecified: Secondary | ICD-10-CM | POA: Diagnosis not present

## 2023-07-20 DIAGNOSIS — F17210 Nicotine dependence, cigarettes, uncomplicated: Secondary | ICD-10-CM | POA: Diagnosis not present

## 2023-07-20 DIAGNOSIS — Z0000 Encounter for general adult medical examination without abnormal findings: Secondary | ICD-10-CM | POA: Diagnosis not present

## 2023-07-20 DIAGNOSIS — M1990 Unspecified osteoarthritis, unspecified site: Secondary | ICD-10-CM | POA: Diagnosis not present

## 2023-07-20 DIAGNOSIS — R7303 Prediabetes: Secondary | ICD-10-CM | POA: Diagnosis not present

## 2023-07-20 DIAGNOSIS — Z0001 Encounter for general adult medical examination with abnormal findings: Secondary | ICD-10-CM | POA: Diagnosis not present

## 2023-07-20 DIAGNOSIS — M130 Polyarthritis, unspecified: Secondary | ICD-10-CM | POA: Diagnosis not present

## 2023-07-20 DIAGNOSIS — R7301 Impaired fasting glucose: Secondary | ICD-10-CM | POA: Diagnosis not present

## 2023-07-20 DIAGNOSIS — J069 Acute upper respiratory infection, unspecified: Secondary | ICD-10-CM | POA: Diagnosis not present

## 2023-07-20 DIAGNOSIS — I700 Atherosclerosis of aorta: Secondary | ICD-10-CM | POA: Diagnosis not present

## 2023-10-26 DIAGNOSIS — E785 Hyperlipidemia, unspecified: Secondary | ICD-10-CM | POA: Diagnosis not present

## 2023-10-26 DIAGNOSIS — R748 Abnormal levels of other serum enzymes: Secondary | ICD-10-CM | POA: Diagnosis not present

## 2023-10-26 DIAGNOSIS — R7301 Impaired fasting glucose: Secondary | ICD-10-CM | POA: Diagnosis not present

## 2023-11-09 DIAGNOSIS — M1389 Other specified arthritis, multiple sites: Secondary | ICD-10-CM | POA: Diagnosis not present

## 2023-11-09 DIAGNOSIS — E785 Hyperlipidemia, unspecified: Secondary | ICD-10-CM | POA: Diagnosis not present

## 2023-11-09 DIAGNOSIS — R748 Abnormal levels of other serum enzymes: Secondary | ICD-10-CM | POA: Diagnosis not present

## 2023-11-09 DIAGNOSIS — M79621 Pain in right upper arm: Secondary | ICD-10-CM | POA: Diagnosis not present

## 2023-11-09 DIAGNOSIS — R7303 Prediabetes: Secondary | ICD-10-CM | POA: Diagnosis not present

## 2023-11-09 DIAGNOSIS — J439 Emphysema, unspecified: Secondary | ICD-10-CM | POA: Diagnosis not present

## 2023-11-09 DIAGNOSIS — I700 Atherosclerosis of aorta: Secondary | ICD-10-CM | POA: Diagnosis not present

## 2023-11-09 DIAGNOSIS — M79622 Pain in left upper arm: Secondary | ICD-10-CM | POA: Diagnosis not present

## 2023-11-09 DIAGNOSIS — F17210 Nicotine dependence, cigarettes, uncomplicated: Secondary | ICD-10-CM | POA: Diagnosis not present

## 2023-11-09 DIAGNOSIS — R7989 Other specified abnormal findings of blood chemistry: Secondary | ICD-10-CM | POA: Diagnosis not present

## 2023-11-09 DIAGNOSIS — R7301 Impaired fasting glucose: Secondary | ICD-10-CM | POA: Diagnosis not present

## 2023-11-23 ENCOUNTER — Encounter: Payer: Self-pay | Admitting: Orthopedic Surgery

## 2023-11-23 ENCOUNTER — Ambulatory Visit (INDEPENDENT_AMBULATORY_CARE_PROVIDER_SITE_OTHER): Admitting: Orthopedic Surgery

## 2023-11-23 ENCOUNTER — Other Ambulatory Visit (INDEPENDENT_AMBULATORY_CARE_PROVIDER_SITE_OTHER)

## 2023-11-23 VITALS — BP 164/102 | HR 85 | Ht 67.0 in | Wt 116.0 lb

## 2023-11-23 DIAGNOSIS — M792 Neuralgia and neuritis, unspecified: Secondary | ICD-10-CM

## 2023-11-23 DIAGNOSIS — M542 Cervicalgia: Secondary | ICD-10-CM | POA: Diagnosis not present
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
## 2023-11-23 NOTE — Addendum Note (Signed)
 Addended byArla Lab on: 11/23/2023 09:21 AM   Modules accepted: Orders

## 2024-01-06 ENCOUNTER — Other Ambulatory Visit (INDEPENDENT_AMBULATORY_CARE_PROVIDER_SITE_OTHER)

## 2024-01-06 ENCOUNTER — Ambulatory Visit (INDEPENDENT_AMBULATORY_CARE_PROVIDER_SITE_OTHER): Admitting: Orthopedic Surgery

## 2024-01-06 DIAGNOSIS — Z720 Tobacco use: Secondary | ICD-10-CM

## 2024-01-06 DIAGNOSIS — M542 Cervicalgia: Secondary | ICD-10-CM | POA: Diagnosis not present

## 2024-01-06 DIAGNOSIS — F17210 Nicotine dependence, cigarettes, uncomplicated: Secondary | ICD-10-CM | POA: Diagnosis not present

## 2024-01-06 MED ORDER — CYCLOBENZAPRINE HCL 10 MG PO TABS: 10.0000 mg | ORAL_TABLET | Freq: Three times a day (TID) | ORAL | 0 refills | Status: AC | PRN

## 2024-01-06 MED ORDER — METHYLPREDNISOLONE 4 MG PO TBPK: ORAL_TABLET | ORAL | 0 refills | Status: AC
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
## 2024-01-18 ENCOUNTER — Other Ambulatory Visit (HOSPITAL_COMMUNITY): Payer: Self-pay | Admitting: Nurse Practitioner

## 2024-01-18 DIAGNOSIS — Z122 Encounter for screening for malignant neoplasm of respiratory organs: Secondary | ICD-10-CM

## 2024-01-21 ENCOUNTER — Ambulatory Visit (HOSPITAL_COMMUNITY)

## 2024-02-16 DIAGNOSIS — E785 Hyperlipidemia, unspecified: Secondary | ICD-10-CM | POA: Diagnosis not present

## 2024-02-16 DIAGNOSIS — R7301 Impaired fasting glucose: Secondary | ICD-10-CM | POA: Diagnosis not present

## 2024-02-22 DIAGNOSIS — E785 Hyperlipidemia, unspecified: Secondary | ICD-10-CM | POA: Diagnosis not present

## 2024-02-22 DIAGNOSIS — R748 Abnormal levels of other serum enzymes: Secondary | ICD-10-CM | POA: Diagnosis not present

## 2024-02-22 DIAGNOSIS — M130 Polyarthritis, unspecified: Secondary | ICD-10-CM | POA: Diagnosis not present

## 2024-02-22 DIAGNOSIS — Z23 Encounter for immunization: Secondary | ICD-10-CM | POA: Diagnosis not present

## 2024-02-22 DIAGNOSIS — M79621 Pain in right upper arm: Secondary | ICD-10-CM | POA: Diagnosis not present

## 2024-02-22 DIAGNOSIS — I700 Atherosclerosis of aorta: Secondary | ICD-10-CM | POA: Diagnosis not present

## 2024-02-22 DIAGNOSIS — R7303 Prediabetes: Secondary | ICD-10-CM | POA: Diagnosis not present

## 2024-02-22 DIAGNOSIS — R7989 Other specified abnormal findings of blood chemistry: Secondary | ICD-10-CM | POA: Diagnosis not present

## 2024-02-22 DIAGNOSIS — R7301 Impaired fasting glucose: Secondary | ICD-10-CM | POA: Diagnosis not present

## 2024-02-22 DIAGNOSIS — M79622 Pain in left upper arm: Secondary | ICD-10-CM | POA: Diagnosis not present
